# Patient Record
Sex: Female | Born: 1935 | Race: Black or African American | Hispanic: No | Smoking: Never smoker
Health system: Southern US, Community
[De-identification: ages and names within clinical notes are randomized; demographics above are authoritative.]

## PROBLEM LIST (undated history)

## (undated) DIAGNOSIS — I10 Essential (primary) hypertension: Secondary | ICD-10-CM

## (undated) DIAGNOSIS — M1990 Unspecified osteoarthritis, unspecified site: Secondary | ICD-10-CM

## (undated) DIAGNOSIS — I509 Heart failure, unspecified: Secondary | ICD-10-CM

## (undated) HISTORY — PX: ABDOMINAL HYSTERECTOMY: SHX81

---

## 1998-09-22 ENCOUNTER — Other Ambulatory Visit: Admission: RE | Admit: 1998-09-22 | Discharge: 1998-09-22 | Payer: Self-pay | Admitting: Family Medicine

## 2011-07-20 ENCOUNTER — Ambulatory Visit
Admission: RE | Admit: 2011-07-20 | Discharge: 2011-07-20 | Disposition: A | Discharge status: Home / Self Care | Payer: Medicare Other | Source: Ambulatory Visit | Attending: Family Medicine | Admitting: Family Medicine

## 2011-07-20 ENCOUNTER — Other Ambulatory Visit: Payer: Self-pay | Admitting: Family Medicine

## 2011-07-20 DIAGNOSIS — M1250 Traumatic arthropathy, unspecified site: Secondary | ICD-10-CM

## 2014-04-02 DIAGNOSIS — E0800 Diabetes mellitus due to underlying condition with hyperosmolarity without nonketotic hyperglycemic-hyperosmolar coma (NKHHC): Secondary | ICD-10-CM | POA: Diagnosis not present

## 2014-04-02 DIAGNOSIS — M1991 Primary osteoarthritis, unspecified site: Secondary | ICD-10-CM | POA: Diagnosis not present

## 2014-04-02 DIAGNOSIS — I10 Essential (primary) hypertension: Secondary | ICD-10-CM | POA: Diagnosis not present

## 2014-04-02 DIAGNOSIS — E782 Mixed hyperlipidemia: Secondary | ICD-10-CM | POA: Diagnosis not present

## 2014-04-20 DIAGNOSIS — I10 Essential (primary) hypertension: Secondary | ICD-10-CM | POA: Diagnosis not present

## 2014-04-21 DIAGNOSIS — I10 Essential (primary) hypertension: Secondary | ICD-10-CM | POA: Diagnosis not present

## 2014-04-22 DIAGNOSIS — I10 Essential (primary) hypertension: Secondary | ICD-10-CM | POA: Diagnosis not present

## 2014-04-23 DIAGNOSIS — I10 Essential (primary) hypertension: Secondary | ICD-10-CM | POA: Diagnosis not present

## 2014-04-24 DIAGNOSIS — I10 Essential (primary) hypertension: Secondary | ICD-10-CM | POA: Diagnosis not present

## 2014-04-27 DIAGNOSIS — I10 Essential (primary) hypertension: Secondary | ICD-10-CM | POA: Diagnosis not present

## 2014-04-28 DIAGNOSIS — I10 Essential (primary) hypertension: Secondary | ICD-10-CM | POA: Diagnosis not present

## 2014-04-29 DIAGNOSIS — I10 Essential (primary) hypertension: Secondary | ICD-10-CM | POA: Diagnosis not present

## 2014-04-30 DIAGNOSIS — I10 Essential (primary) hypertension: Secondary | ICD-10-CM | POA: Diagnosis not present

## 2014-05-01 DIAGNOSIS — I10 Essential (primary) hypertension: Secondary | ICD-10-CM | POA: Diagnosis not present

## 2014-05-04 DIAGNOSIS — M170 Bilateral primary osteoarthritis of knee: Secondary | ICD-10-CM | POA: Diagnosis not present

## 2014-05-04 DIAGNOSIS — I10 Essential (primary) hypertension: Secondary | ICD-10-CM | POA: Diagnosis not present

## 2014-05-05 DIAGNOSIS — I10 Essential (primary) hypertension: Secondary | ICD-10-CM | POA: Diagnosis not present

## 2014-05-06 DIAGNOSIS — I10 Essential (primary) hypertension: Secondary | ICD-10-CM | POA: Diagnosis not present

## 2014-05-07 DIAGNOSIS — I10 Essential (primary) hypertension: Secondary | ICD-10-CM | POA: Diagnosis not present

## 2014-05-08 DIAGNOSIS — E782 Mixed hyperlipidemia: Secondary | ICD-10-CM | POA: Diagnosis not present

## 2014-05-08 DIAGNOSIS — I10 Essential (primary) hypertension: Secondary | ICD-10-CM | POA: Diagnosis not present

## 2014-05-11 DIAGNOSIS — I10 Essential (primary) hypertension: Secondary | ICD-10-CM | POA: Diagnosis not present

## 2014-05-12 DIAGNOSIS — I10 Essential (primary) hypertension: Secondary | ICD-10-CM | POA: Diagnosis not present

## 2014-05-13 DIAGNOSIS — I10 Essential (primary) hypertension: Secondary | ICD-10-CM | POA: Diagnosis not present

## 2014-05-14 DIAGNOSIS — I10 Essential (primary) hypertension: Secondary | ICD-10-CM | POA: Diagnosis not present

## 2014-05-15 DIAGNOSIS — I10 Essential (primary) hypertension: Secondary | ICD-10-CM | POA: Diagnosis not present

## 2014-05-18 DIAGNOSIS — I10 Essential (primary) hypertension: Secondary | ICD-10-CM | POA: Diagnosis not present

## 2014-05-19 DIAGNOSIS — I10 Essential (primary) hypertension: Secondary | ICD-10-CM | POA: Diagnosis not present

## 2014-05-20 DIAGNOSIS — I10 Essential (primary) hypertension: Secondary | ICD-10-CM | POA: Diagnosis not present

## 2014-05-21 DIAGNOSIS — I10 Essential (primary) hypertension: Secondary | ICD-10-CM | POA: Diagnosis not present

## 2014-05-22 DIAGNOSIS — I10 Essential (primary) hypertension: Secondary | ICD-10-CM | POA: Diagnosis not present

## 2014-05-25 DIAGNOSIS — I10 Essential (primary) hypertension: Secondary | ICD-10-CM | POA: Diagnosis not present

## 2014-05-26 DIAGNOSIS — I10 Essential (primary) hypertension: Secondary | ICD-10-CM | POA: Diagnosis not present

## 2014-05-27 DIAGNOSIS — I10 Essential (primary) hypertension: Secondary | ICD-10-CM | POA: Diagnosis not present

## 2014-05-28 DIAGNOSIS — I10 Essential (primary) hypertension: Secondary | ICD-10-CM | POA: Diagnosis not present

## 2014-05-29 DIAGNOSIS — I10 Essential (primary) hypertension: Secondary | ICD-10-CM | POA: Diagnosis not present

## 2014-06-01 DIAGNOSIS — I10 Essential (primary) hypertension: Secondary | ICD-10-CM | POA: Diagnosis not present

## 2014-06-02 DIAGNOSIS — I10 Essential (primary) hypertension: Secondary | ICD-10-CM | POA: Diagnosis not present

## 2014-06-03 DIAGNOSIS — I10 Essential (primary) hypertension: Secondary | ICD-10-CM | POA: Diagnosis not present

## 2014-06-04 DIAGNOSIS — I10 Essential (primary) hypertension: Secondary | ICD-10-CM | POA: Diagnosis not present

## 2014-06-05 DIAGNOSIS — I10 Essential (primary) hypertension: Secondary | ICD-10-CM | POA: Diagnosis not present

## 2014-06-08 DIAGNOSIS — I10 Essential (primary) hypertension: Secondary | ICD-10-CM | POA: Diagnosis not present

## 2014-06-09 DIAGNOSIS — I10 Essential (primary) hypertension: Secondary | ICD-10-CM | POA: Diagnosis not present

## 2014-06-10 DIAGNOSIS — I10 Essential (primary) hypertension: Secondary | ICD-10-CM | POA: Diagnosis not present

## 2014-06-11 DIAGNOSIS — I10 Essential (primary) hypertension: Secondary | ICD-10-CM | POA: Diagnosis not present

## 2014-06-12 DIAGNOSIS — I10 Essential (primary) hypertension: Secondary | ICD-10-CM | POA: Diagnosis not present

## 2014-06-15 DIAGNOSIS — I10 Essential (primary) hypertension: Secondary | ICD-10-CM | POA: Diagnosis not present

## 2014-06-16 DIAGNOSIS — I10 Essential (primary) hypertension: Secondary | ICD-10-CM | POA: Diagnosis not present

## 2014-06-17 DIAGNOSIS — I10 Essential (primary) hypertension: Secondary | ICD-10-CM | POA: Diagnosis not present

## 2014-06-18 DIAGNOSIS — I10 Essential (primary) hypertension: Secondary | ICD-10-CM | POA: Diagnosis not present

## 2014-06-19 DIAGNOSIS — I10 Essential (primary) hypertension: Secondary | ICD-10-CM | POA: Diagnosis not present

## 2014-06-22 DIAGNOSIS — I10 Essential (primary) hypertension: Secondary | ICD-10-CM | POA: Diagnosis not present

## 2014-06-23 DIAGNOSIS — I10 Essential (primary) hypertension: Secondary | ICD-10-CM | POA: Diagnosis not present

## 2014-06-24 DIAGNOSIS — I10 Essential (primary) hypertension: Secondary | ICD-10-CM | POA: Diagnosis not present

## 2014-06-25 DIAGNOSIS — I10 Essential (primary) hypertension: Secondary | ICD-10-CM | POA: Diagnosis not present

## 2014-06-26 DIAGNOSIS — I10 Essential (primary) hypertension: Secondary | ICD-10-CM | POA: Diagnosis not present

## 2014-06-29 DIAGNOSIS — I10 Essential (primary) hypertension: Secondary | ICD-10-CM | POA: Diagnosis not present

## 2014-06-30 DIAGNOSIS — I10 Essential (primary) hypertension: Secondary | ICD-10-CM | POA: Diagnosis not present

## 2014-07-01 DIAGNOSIS — I10 Essential (primary) hypertension: Secondary | ICD-10-CM | POA: Diagnosis not present

## 2014-07-02 DIAGNOSIS — I10 Essential (primary) hypertension: Secondary | ICD-10-CM | POA: Diagnosis not present

## 2014-07-03 DIAGNOSIS — I10 Essential (primary) hypertension: Secondary | ICD-10-CM | POA: Diagnosis not present

## 2014-07-06 DIAGNOSIS — I10 Essential (primary) hypertension: Secondary | ICD-10-CM | POA: Diagnosis not present

## 2014-07-07 DIAGNOSIS — I10 Essential (primary) hypertension: Secondary | ICD-10-CM | POA: Diagnosis not present

## 2014-07-08 DIAGNOSIS — I10 Essential (primary) hypertension: Secondary | ICD-10-CM | POA: Diagnosis not present

## 2014-07-09 DIAGNOSIS — I10 Essential (primary) hypertension: Secondary | ICD-10-CM | POA: Diagnosis not present

## 2014-07-10 DIAGNOSIS — I10 Essential (primary) hypertension: Secondary | ICD-10-CM | POA: Diagnosis not present

## 2014-07-13 DIAGNOSIS — I10 Essential (primary) hypertension: Secondary | ICD-10-CM | POA: Diagnosis not present

## 2014-07-14 DIAGNOSIS — I10 Essential (primary) hypertension: Secondary | ICD-10-CM | POA: Diagnosis not present

## 2014-07-15 DIAGNOSIS — I10 Essential (primary) hypertension: Secondary | ICD-10-CM | POA: Diagnosis not present

## 2014-07-16 DIAGNOSIS — I10 Essential (primary) hypertension: Secondary | ICD-10-CM | POA: Diagnosis not present

## 2014-07-17 DIAGNOSIS — I10 Essential (primary) hypertension: Secondary | ICD-10-CM | POA: Diagnosis not present

## 2014-07-20 DIAGNOSIS — I10 Essential (primary) hypertension: Secondary | ICD-10-CM | POA: Diagnosis not present

## 2014-07-21 DIAGNOSIS — I10 Essential (primary) hypertension: Secondary | ICD-10-CM | POA: Diagnosis not present

## 2014-07-22 DIAGNOSIS — I10 Essential (primary) hypertension: Secondary | ICD-10-CM | POA: Diagnosis not present

## 2014-07-23 DIAGNOSIS — I10 Essential (primary) hypertension: Secondary | ICD-10-CM | POA: Diagnosis not present

## 2014-07-24 DIAGNOSIS — I10 Essential (primary) hypertension: Secondary | ICD-10-CM | POA: Diagnosis not present

## 2014-07-27 DIAGNOSIS — I10 Essential (primary) hypertension: Secondary | ICD-10-CM | POA: Diagnosis not present

## 2014-07-28 DIAGNOSIS — I10 Essential (primary) hypertension: Secondary | ICD-10-CM | POA: Diagnosis not present

## 2014-07-29 DIAGNOSIS — I10 Essential (primary) hypertension: Secondary | ICD-10-CM | POA: Diagnosis not present

## 2014-07-30 DIAGNOSIS — I10 Essential (primary) hypertension: Secondary | ICD-10-CM | POA: Diagnosis not present

## 2014-07-31 DIAGNOSIS — I10 Essential (primary) hypertension: Secondary | ICD-10-CM | POA: Diagnosis not present

## 2014-08-03 DIAGNOSIS — M1991 Primary osteoarthritis, unspecified site: Secondary | ICD-10-CM | POA: Diagnosis not present

## 2014-08-03 DIAGNOSIS — I10 Essential (primary) hypertension: Secondary | ICD-10-CM | POA: Diagnosis not present

## 2014-08-03 DIAGNOSIS — R0602 Shortness of breath: Secondary | ICD-10-CM | POA: Diagnosis not present

## 2014-08-03 DIAGNOSIS — E0800 Diabetes mellitus due to underlying condition with hyperosmolarity without nonketotic hyperglycemic-hyperosmolar coma (NKHHC): Secondary | ICD-10-CM | POA: Diagnosis not present

## 2014-08-04 DIAGNOSIS — M1991 Primary osteoarthritis, unspecified site: Secondary | ICD-10-CM | POA: Diagnosis not present

## 2014-08-04 DIAGNOSIS — I10 Essential (primary) hypertension: Secondary | ICD-10-CM | POA: Diagnosis not present

## 2014-08-04 DIAGNOSIS — E119 Type 2 diabetes mellitus without complications: Secondary | ICD-10-CM | POA: Diagnosis not present

## 2014-08-05 DIAGNOSIS — I10 Essential (primary) hypertension: Secondary | ICD-10-CM | POA: Diagnosis not present

## 2014-08-06 DIAGNOSIS — E119 Type 2 diabetes mellitus without complications: Secondary | ICD-10-CM | POA: Diagnosis not present

## 2014-08-06 DIAGNOSIS — I10 Essential (primary) hypertension: Secondary | ICD-10-CM | POA: Diagnosis not present

## 2014-08-06 DIAGNOSIS — M1991 Primary osteoarthritis, unspecified site: Secondary | ICD-10-CM | POA: Diagnosis not present

## 2014-08-07 DIAGNOSIS — I10 Essential (primary) hypertension: Secondary | ICD-10-CM | POA: Diagnosis not present

## 2014-08-10 DIAGNOSIS — E119 Type 2 diabetes mellitus without complications: Secondary | ICD-10-CM | POA: Diagnosis not present

## 2014-08-10 DIAGNOSIS — I10 Essential (primary) hypertension: Secondary | ICD-10-CM | POA: Diagnosis not present

## 2014-08-10 DIAGNOSIS — M1991 Primary osteoarthritis, unspecified site: Secondary | ICD-10-CM | POA: Diagnosis not present

## 2014-08-11 DIAGNOSIS — I10 Essential (primary) hypertension: Secondary | ICD-10-CM | POA: Diagnosis not present

## 2014-08-12 DIAGNOSIS — I10 Essential (primary) hypertension: Secondary | ICD-10-CM | POA: Diagnosis not present

## 2014-08-13 DIAGNOSIS — E119 Type 2 diabetes mellitus without complications: Secondary | ICD-10-CM | POA: Diagnosis not present

## 2014-08-13 DIAGNOSIS — I10 Essential (primary) hypertension: Secondary | ICD-10-CM | POA: Diagnosis not present

## 2014-08-13 DIAGNOSIS — M1991 Primary osteoarthritis, unspecified site: Secondary | ICD-10-CM | POA: Diagnosis not present

## 2014-08-14 DIAGNOSIS — I10 Essential (primary) hypertension: Secondary | ICD-10-CM | POA: Diagnosis not present

## 2014-08-19 DIAGNOSIS — I10 Essential (primary) hypertension: Secondary | ICD-10-CM | POA: Diagnosis not present

## 2014-08-19 DIAGNOSIS — M1991 Primary osteoarthritis, unspecified site: Secondary | ICD-10-CM | POA: Diagnosis not present

## 2014-08-19 DIAGNOSIS — E119 Type 2 diabetes mellitus without complications: Secondary | ICD-10-CM | POA: Diagnosis not present

## 2014-10-26 DIAGNOSIS — H11159 Pinguecula, unspecified eye: Secondary | ICD-10-CM | POA: Diagnosis not present

## 2014-10-26 DIAGNOSIS — H40013 Open angle with borderline findings, low risk, bilateral: Secondary | ICD-10-CM | POA: Diagnosis not present

## 2014-11-11 DIAGNOSIS — I10 Essential (primary) hypertension: Secondary | ICD-10-CM | POA: Diagnosis not present

## 2014-11-11 DIAGNOSIS — R7309 Other abnormal glucose: Secondary | ICD-10-CM | POA: Diagnosis not present

## 2014-11-11 DIAGNOSIS — M1991 Primary osteoarthritis, unspecified site: Secondary | ICD-10-CM | POA: Diagnosis not present

## 2014-11-11 DIAGNOSIS — I110 Hypertensive heart disease with heart failure: Secondary | ICD-10-CM | POA: Diagnosis not present

## 2014-11-11 DIAGNOSIS — E782 Mixed hyperlipidemia: Secondary | ICD-10-CM | POA: Diagnosis not present

## 2014-11-18 DIAGNOSIS — I110 Hypertensive heart disease with heart failure: Secondary | ICD-10-CM | POA: Diagnosis not present

## 2014-11-18 DIAGNOSIS — R0602 Shortness of breath: Secondary | ICD-10-CM | POA: Diagnosis not present

## 2014-12-09 DIAGNOSIS — I10 Essential (primary) hypertension: Secondary | ICD-10-CM | POA: Diagnosis not present

## 2014-12-09 DIAGNOSIS — M1991 Primary osteoarthritis, unspecified site: Secondary | ICD-10-CM | POA: Diagnosis not present

## 2014-12-09 DIAGNOSIS — E118 Type 2 diabetes mellitus with unspecified complications: Secondary | ICD-10-CM | POA: Diagnosis not present

## 2014-12-09 DIAGNOSIS — E785 Hyperlipidemia, unspecified: Secondary | ICD-10-CM | POA: Diagnosis not present

## 2014-12-11 DIAGNOSIS — M790 Rheumatism, unspecified: Secondary | ICD-10-CM | POA: Diagnosis not present

## 2014-12-11 DIAGNOSIS — M1991 Primary osteoarthritis, unspecified site: Secondary | ICD-10-CM | POA: Diagnosis not present

## 2014-12-11 DIAGNOSIS — E119 Type 2 diabetes mellitus without complications: Secondary | ICD-10-CM | POA: Diagnosis not present

## 2014-12-11 DIAGNOSIS — I509 Heart failure, unspecified: Secondary | ICD-10-CM | POA: Diagnosis not present

## 2014-12-11 DIAGNOSIS — Z7952 Long term (current) use of systemic steroids: Secondary | ICD-10-CM | POA: Diagnosis not present

## 2014-12-11 DIAGNOSIS — I110 Hypertensive heart disease with heart failure: Secondary | ICD-10-CM | POA: Diagnosis not present

## 2014-12-11 DIAGNOSIS — R634 Abnormal weight loss: Secondary | ICD-10-CM | POA: Diagnosis not present

## 2014-12-11 DIAGNOSIS — Z87891 Personal history of nicotine dependence: Secondary | ICD-10-CM | POA: Diagnosis not present

## 2014-12-15 DIAGNOSIS — E119 Type 2 diabetes mellitus without complications: Secondary | ICD-10-CM | POA: Diagnosis not present

## 2014-12-15 DIAGNOSIS — M790 Rheumatism, unspecified: Secondary | ICD-10-CM | POA: Diagnosis not present

## 2014-12-15 DIAGNOSIS — I110 Hypertensive heart disease with heart failure: Secondary | ICD-10-CM | POA: Diagnosis not present

## 2014-12-15 DIAGNOSIS — I509 Heart failure, unspecified: Secondary | ICD-10-CM | POA: Diagnosis not present

## 2014-12-15 DIAGNOSIS — M1991 Primary osteoarthritis, unspecified site: Secondary | ICD-10-CM | POA: Diagnosis not present

## 2014-12-15 DIAGNOSIS — R634 Abnormal weight loss: Secondary | ICD-10-CM | POA: Diagnosis not present

## 2014-12-15 DIAGNOSIS — Z87891 Personal history of nicotine dependence: Secondary | ICD-10-CM | POA: Diagnosis not present

## 2014-12-15 DIAGNOSIS — Z7952 Long term (current) use of systemic steroids: Secondary | ICD-10-CM | POA: Diagnosis not present

## 2014-12-16 DIAGNOSIS — Z87891 Personal history of nicotine dependence: Secondary | ICD-10-CM | POA: Diagnosis not present

## 2014-12-16 DIAGNOSIS — R634 Abnormal weight loss: Secondary | ICD-10-CM | POA: Diagnosis not present

## 2014-12-16 DIAGNOSIS — E119 Type 2 diabetes mellitus without complications: Secondary | ICD-10-CM | POA: Diagnosis not present

## 2014-12-16 DIAGNOSIS — Z7952 Long term (current) use of systemic steroids: Secondary | ICD-10-CM | POA: Diagnosis not present

## 2014-12-16 DIAGNOSIS — M1991 Primary osteoarthritis, unspecified site: Secondary | ICD-10-CM | POA: Diagnosis not present

## 2014-12-16 DIAGNOSIS — I110 Hypertensive heart disease with heart failure: Secondary | ICD-10-CM | POA: Diagnosis not present

## 2014-12-16 DIAGNOSIS — M790 Rheumatism, unspecified: Secondary | ICD-10-CM | POA: Diagnosis not present

## 2014-12-16 DIAGNOSIS — I509 Heart failure, unspecified: Secondary | ICD-10-CM | POA: Diagnosis not present

## 2014-12-17 DIAGNOSIS — M1991 Primary osteoarthritis, unspecified site: Secondary | ICD-10-CM | POA: Diagnosis not present

## 2014-12-17 DIAGNOSIS — M790 Rheumatism, unspecified: Secondary | ICD-10-CM | POA: Diagnosis not present

## 2014-12-17 DIAGNOSIS — E119 Type 2 diabetes mellitus without complications: Secondary | ICD-10-CM | POA: Diagnosis not present

## 2014-12-17 DIAGNOSIS — Z87891 Personal history of nicotine dependence: Secondary | ICD-10-CM | POA: Diagnosis not present

## 2014-12-17 DIAGNOSIS — Z7952 Long term (current) use of systemic steroids: Secondary | ICD-10-CM | POA: Diagnosis not present

## 2014-12-17 DIAGNOSIS — R634 Abnormal weight loss: Secondary | ICD-10-CM | POA: Diagnosis not present

## 2014-12-17 DIAGNOSIS — I509 Heart failure, unspecified: Secondary | ICD-10-CM | POA: Diagnosis not present

## 2014-12-17 DIAGNOSIS — I110 Hypertensive heart disease with heart failure: Secondary | ICD-10-CM | POA: Diagnosis not present

## 2014-12-21 DIAGNOSIS — R634 Abnormal weight loss: Secondary | ICD-10-CM | POA: Diagnosis not present

## 2014-12-21 DIAGNOSIS — E119 Type 2 diabetes mellitus without complications: Secondary | ICD-10-CM | POA: Diagnosis not present

## 2014-12-21 DIAGNOSIS — I110 Hypertensive heart disease with heart failure: Secondary | ICD-10-CM | POA: Diagnosis not present

## 2014-12-21 DIAGNOSIS — M790 Rheumatism, unspecified: Secondary | ICD-10-CM | POA: Diagnosis not present

## 2014-12-21 DIAGNOSIS — I509 Heart failure, unspecified: Secondary | ICD-10-CM | POA: Diagnosis not present

## 2014-12-21 DIAGNOSIS — Z7952 Long term (current) use of systemic steroids: Secondary | ICD-10-CM | POA: Diagnosis not present

## 2014-12-21 DIAGNOSIS — Z87891 Personal history of nicotine dependence: Secondary | ICD-10-CM | POA: Diagnosis not present

## 2014-12-21 DIAGNOSIS — M1991 Primary osteoarthritis, unspecified site: Secondary | ICD-10-CM | POA: Diagnosis not present

## 2014-12-23 DIAGNOSIS — M790 Rheumatism, unspecified: Secondary | ICD-10-CM | POA: Diagnosis not present

## 2014-12-23 DIAGNOSIS — R634 Abnormal weight loss: Secondary | ICD-10-CM | POA: Diagnosis not present

## 2014-12-23 DIAGNOSIS — I110 Hypertensive heart disease with heart failure: Secondary | ICD-10-CM | POA: Diagnosis not present

## 2014-12-23 DIAGNOSIS — I509 Heart failure, unspecified: Secondary | ICD-10-CM | POA: Diagnosis not present

## 2014-12-23 DIAGNOSIS — E119 Type 2 diabetes mellitus without complications: Secondary | ICD-10-CM | POA: Diagnosis not present

## 2014-12-23 DIAGNOSIS — Z7952 Long term (current) use of systemic steroids: Secondary | ICD-10-CM | POA: Diagnosis not present

## 2014-12-23 DIAGNOSIS — Z87891 Personal history of nicotine dependence: Secondary | ICD-10-CM | POA: Diagnosis not present

## 2014-12-23 DIAGNOSIS — M1991 Primary osteoarthritis, unspecified site: Secondary | ICD-10-CM | POA: Diagnosis not present

## 2014-12-25 DIAGNOSIS — Z7952 Long term (current) use of systemic steroids: Secondary | ICD-10-CM | POA: Diagnosis not present

## 2014-12-25 DIAGNOSIS — M1991 Primary osteoarthritis, unspecified site: Secondary | ICD-10-CM | POA: Diagnosis not present

## 2014-12-25 DIAGNOSIS — I509 Heart failure, unspecified: Secondary | ICD-10-CM | POA: Diagnosis not present

## 2014-12-25 DIAGNOSIS — I110 Hypertensive heart disease with heart failure: Secondary | ICD-10-CM | POA: Diagnosis not present

## 2014-12-25 DIAGNOSIS — E119 Type 2 diabetes mellitus without complications: Secondary | ICD-10-CM | POA: Diagnosis not present

## 2014-12-25 DIAGNOSIS — R634 Abnormal weight loss: Secondary | ICD-10-CM | POA: Diagnosis not present

## 2014-12-25 DIAGNOSIS — Z87891 Personal history of nicotine dependence: Secondary | ICD-10-CM | POA: Diagnosis not present

## 2014-12-25 DIAGNOSIS — M790 Rheumatism, unspecified: Secondary | ICD-10-CM | POA: Diagnosis not present

## 2014-12-28 DIAGNOSIS — I110 Hypertensive heart disease with heart failure: Secondary | ICD-10-CM | POA: Diagnosis not present

## 2014-12-28 DIAGNOSIS — Z7952 Long term (current) use of systemic steroids: Secondary | ICD-10-CM | POA: Diagnosis not present

## 2014-12-28 DIAGNOSIS — M1991 Primary osteoarthritis, unspecified site: Secondary | ICD-10-CM | POA: Diagnosis not present

## 2014-12-28 DIAGNOSIS — M790 Rheumatism, unspecified: Secondary | ICD-10-CM | POA: Diagnosis not present

## 2014-12-28 DIAGNOSIS — E119 Type 2 diabetes mellitus without complications: Secondary | ICD-10-CM | POA: Diagnosis not present

## 2014-12-28 DIAGNOSIS — Z87891 Personal history of nicotine dependence: Secondary | ICD-10-CM | POA: Diagnosis not present

## 2014-12-28 DIAGNOSIS — I509 Heart failure, unspecified: Secondary | ICD-10-CM | POA: Diagnosis not present

## 2014-12-28 DIAGNOSIS — R634 Abnormal weight loss: Secondary | ICD-10-CM | POA: Diagnosis not present

## 2014-12-29 DIAGNOSIS — E119 Type 2 diabetes mellitus without complications: Secondary | ICD-10-CM | POA: Diagnosis not present

## 2014-12-29 DIAGNOSIS — Z87891 Personal history of nicotine dependence: Secondary | ICD-10-CM | POA: Diagnosis not present

## 2014-12-29 DIAGNOSIS — M790 Rheumatism, unspecified: Secondary | ICD-10-CM | POA: Diagnosis not present

## 2014-12-29 DIAGNOSIS — M1991 Primary osteoarthritis, unspecified site: Secondary | ICD-10-CM | POA: Diagnosis not present

## 2014-12-29 DIAGNOSIS — I509 Heart failure, unspecified: Secondary | ICD-10-CM | POA: Diagnosis not present

## 2014-12-29 DIAGNOSIS — I110 Hypertensive heart disease with heart failure: Secondary | ICD-10-CM | POA: Diagnosis not present

## 2014-12-29 DIAGNOSIS — R634 Abnormal weight loss: Secondary | ICD-10-CM | POA: Diagnosis not present

## 2014-12-29 DIAGNOSIS — Z7952 Long term (current) use of systemic steroids: Secondary | ICD-10-CM | POA: Diagnosis not present

## 2014-12-30 DIAGNOSIS — R634 Abnormal weight loss: Secondary | ICD-10-CM | POA: Diagnosis not present

## 2014-12-30 DIAGNOSIS — I509 Heart failure, unspecified: Secondary | ICD-10-CM | POA: Diagnosis not present

## 2014-12-30 DIAGNOSIS — E119 Type 2 diabetes mellitus without complications: Secondary | ICD-10-CM | POA: Diagnosis not present

## 2014-12-30 DIAGNOSIS — Z87891 Personal history of nicotine dependence: Secondary | ICD-10-CM | POA: Diagnosis not present

## 2014-12-30 DIAGNOSIS — Z7952 Long term (current) use of systemic steroids: Secondary | ICD-10-CM | POA: Diagnosis not present

## 2014-12-30 DIAGNOSIS — M790 Rheumatism, unspecified: Secondary | ICD-10-CM | POA: Diagnosis not present

## 2014-12-30 DIAGNOSIS — I110 Hypertensive heart disease with heart failure: Secondary | ICD-10-CM | POA: Diagnosis not present

## 2014-12-30 DIAGNOSIS — M1991 Primary osteoarthritis, unspecified site: Secondary | ICD-10-CM | POA: Diagnosis not present

## 2014-12-31 DIAGNOSIS — M790 Rheumatism, unspecified: Secondary | ICD-10-CM | POA: Diagnosis not present

## 2014-12-31 DIAGNOSIS — Z87891 Personal history of nicotine dependence: Secondary | ICD-10-CM | POA: Diagnosis not present

## 2014-12-31 DIAGNOSIS — Z7952 Long term (current) use of systemic steroids: Secondary | ICD-10-CM | POA: Diagnosis not present

## 2014-12-31 DIAGNOSIS — E119 Type 2 diabetes mellitus without complications: Secondary | ICD-10-CM | POA: Diagnosis not present

## 2014-12-31 DIAGNOSIS — I509 Heart failure, unspecified: Secondary | ICD-10-CM | POA: Diagnosis not present

## 2014-12-31 DIAGNOSIS — R634 Abnormal weight loss: Secondary | ICD-10-CM | POA: Diagnosis not present

## 2014-12-31 DIAGNOSIS — I110 Hypertensive heart disease with heart failure: Secondary | ICD-10-CM | POA: Diagnosis not present

## 2014-12-31 DIAGNOSIS — M1991 Primary osteoarthritis, unspecified site: Secondary | ICD-10-CM | POA: Diagnosis not present

## 2015-01-04 DIAGNOSIS — E119 Type 2 diabetes mellitus without complications: Secondary | ICD-10-CM | POA: Diagnosis not present

## 2015-01-04 DIAGNOSIS — Z87891 Personal history of nicotine dependence: Secondary | ICD-10-CM | POA: Diagnosis not present

## 2015-01-04 DIAGNOSIS — I509 Heart failure, unspecified: Secondary | ICD-10-CM | POA: Diagnosis not present

## 2015-01-04 DIAGNOSIS — R634 Abnormal weight loss: Secondary | ICD-10-CM | POA: Diagnosis not present

## 2015-01-04 DIAGNOSIS — M790 Rheumatism, unspecified: Secondary | ICD-10-CM | POA: Diagnosis not present

## 2015-01-04 DIAGNOSIS — Z7952 Long term (current) use of systemic steroids: Secondary | ICD-10-CM | POA: Diagnosis not present

## 2015-01-04 DIAGNOSIS — M1991 Primary osteoarthritis, unspecified site: Secondary | ICD-10-CM | POA: Diagnosis not present

## 2015-01-04 DIAGNOSIS — I110 Hypertensive heart disease with heart failure: Secondary | ICD-10-CM | POA: Diagnosis not present

## 2015-01-05 DIAGNOSIS — M1991 Primary osteoarthritis, unspecified site: Secondary | ICD-10-CM | POA: Diagnosis not present

## 2015-01-05 DIAGNOSIS — R634 Abnormal weight loss: Secondary | ICD-10-CM | POA: Diagnosis not present

## 2015-01-05 DIAGNOSIS — E119 Type 2 diabetes mellitus without complications: Secondary | ICD-10-CM | POA: Diagnosis not present

## 2015-01-05 DIAGNOSIS — Z87891 Personal history of nicotine dependence: Secondary | ICD-10-CM | POA: Diagnosis not present

## 2015-01-05 DIAGNOSIS — M790 Rheumatism, unspecified: Secondary | ICD-10-CM | POA: Diagnosis not present

## 2015-01-05 DIAGNOSIS — Z7952 Long term (current) use of systemic steroids: Secondary | ICD-10-CM | POA: Diagnosis not present

## 2015-01-05 DIAGNOSIS — I509 Heart failure, unspecified: Secondary | ICD-10-CM | POA: Diagnosis not present

## 2015-01-05 DIAGNOSIS — I110 Hypertensive heart disease with heart failure: Secondary | ICD-10-CM | POA: Diagnosis not present

## 2015-01-06 DIAGNOSIS — E119 Type 2 diabetes mellitus without complications: Secondary | ICD-10-CM | POA: Diagnosis not present

## 2015-01-06 DIAGNOSIS — I509 Heart failure, unspecified: Secondary | ICD-10-CM | POA: Diagnosis not present

## 2015-01-06 DIAGNOSIS — M1991 Primary osteoarthritis, unspecified site: Secondary | ICD-10-CM | POA: Diagnosis not present

## 2015-01-06 DIAGNOSIS — Z7952 Long term (current) use of systemic steroids: Secondary | ICD-10-CM | POA: Diagnosis not present

## 2015-01-06 DIAGNOSIS — R634 Abnormal weight loss: Secondary | ICD-10-CM | POA: Diagnosis not present

## 2015-01-06 DIAGNOSIS — M790 Rheumatism, unspecified: Secondary | ICD-10-CM | POA: Diagnosis not present

## 2015-01-06 DIAGNOSIS — I110 Hypertensive heart disease with heart failure: Secondary | ICD-10-CM | POA: Diagnosis not present

## 2015-01-06 DIAGNOSIS — Z87891 Personal history of nicotine dependence: Secondary | ICD-10-CM | POA: Diagnosis not present

## 2015-01-12 DIAGNOSIS — Z7952 Long term (current) use of systemic steroids: Secondary | ICD-10-CM | POA: Diagnosis not present

## 2015-01-12 DIAGNOSIS — I509 Heart failure, unspecified: Secondary | ICD-10-CM | POA: Diagnosis not present

## 2015-01-12 DIAGNOSIS — R634 Abnormal weight loss: Secondary | ICD-10-CM | POA: Diagnosis not present

## 2015-01-12 DIAGNOSIS — Z87891 Personal history of nicotine dependence: Secondary | ICD-10-CM | POA: Diagnosis not present

## 2015-01-12 DIAGNOSIS — E119 Type 2 diabetes mellitus without complications: Secondary | ICD-10-CM | POA: Diagnosis not present

## 2015-01-12 DIAGNOSIS — M1991 Primary osteoarthritis, unspecified site: Secondary | ICD-10-CM | POA: Diagnosis not present

## 2015-01-12 DIAGNOSIS — M790 Rheumatism, unspecified: Secondary | ICD-10-CM | POA: Diagnosis not present

## 2015-01-12 DIAGNOSIS — I110 Hypertensive heart disease with heart failure: Secondary | ICD-10-CM | POA: Diagnosis not present

## 2015-01-18 DIAGNOSIS — E119 Type 2 diabetes mellitus without complications: Secondary | ICD-10-CM | POA: Diagnosis not present

## 2015-01-18 DIAGNOSIS — R634 Abnormal weight loss: Secondary | ICD-10-CM | POA: Diagnosis not present

## 2015-01-18 DIAGNOSIS — Z87891 Personal history of nicotine dependence: Secondary | ICD-10-CM | POA: Diagnosis not present

## 2015-01-18 DIAGNOSIS — Z7952 Long term (current) use of systemic steroids: Secondary | ICD-10-CM | POA: Diagnosis not present

## 2015-01-18 DIAGNOSIS — M790 Rheumatism, unspecified: Secondary | ICD-10-CM | POA: Diagnosis not present

## 2015-01-18 DIAGNOSIS — M1991 Primary osteoarthritis, unspecified site: Secondary | ICD-10-CM | POA: Diagnosis not present

## 2015-01-18 DIAGNOSIS — I509 Heart failure, unspecified: Secondary | ICD-10-CM | POA: Diagnosis not present

## 2015-01-18 DIAGNOSIS — I110 Hypertensive heart disease with heart failure: Secondary | ICD-10-CM | POA: Diagnosis not present

## 2015-01-26 DIAGNOSIS — Z87891 Personal history of nicotine dependence: Secondary | ICD-10-CM | POA: Diagnosis not present

## 2015-01-26 DIAGNOSIS — E119 Type 2 diabetes mellitus without complications: Secondary | ICD-10-CM | POA: Diagnosis not present

## 2015-01-26 DIAGNOSIS — R634 Abnormal weight loss: Secondary | ICD-10-CM | POA: Diagnosis not present

## 2015-01-26 DIAGNOSIS — M790 Rheumatism, unspecified: Secondary | ICD-10-CM | POA: Diagnosis not present

## 2015-01-26 DIAGNOSIS — I110 Hypertensive heart disease with heart failure: Secondary | ICD-10-CM | POA: Diagnosis not present

## 2015-01-26 DIAGNOSIS — Z7952 Long term (current) use of systemic steroids: Secondary | ICD-10-CM | POA: Diagnosis not present

## 2015-01-26 DIAGNOSIS — M1991 Primary osteoarthritis, unspecified site: Secondary | ICD-10-CM | POA: Diagnosis not present

## 2015-01-26 DIAGNOSIS — I509 Heart failure, unspecified: Secondary | ICD-10-CM | POA: Diagnosis not present

## 2015-02-05 DIAGNOSIS — E119 Type 2 diabetes mellitus without complications: Secondary | ICD-10-CM | POA: Diagnosis not present

## 2015-02-05 DIAGNOSIS — M790 Rheumatism, unspecified: Secondary | ICD-10-CM | POA: Diagnosis not present

## 2015-02-05 DIAGNOSIS — Z87891 Personal history of nicotine dependence: Secondary | ICD-10-CM | POA: Diagnosis not present

## 2015-02-05 DIAGNOSIS — Z7952 Long term (current) use of systemic steroids: Secondary | ICD-10-CM | POA: Diagnosis not present

## 2015-02-05 DIAGNOSIS — I509 Heart failure, unspecified: Secondary | ICD-10-CM | POA: Diagnosis not present

## 2015-02-05 DIAGNOSIS — I110 Hypertensive heart disease with heart failure: Secondary | ICD-10-CM | POA: Diagnosis not present

## 2015-02-05 DIAGNOSIS — M1991 Primary osteoarthritis, unspecified site: Secondary | ICD-10-CM | POA: Diagnosis not present

## 2015-02-05 DIAGNOSIS — R634 Abnormal weight loss: Secondary | ICD-10-CM | POA: Diagnosis not present

## 2015-04-09 DIAGNOSIS — E118 Type 2 diabetes mellitus with unspecified complications: Secondary | ICD-10-CM | POA: Diagnosis not present

## 2015-04-09 DIAGNOSIS — M1991 Primary osteoarthritis, unspecified site: Secondary | ICD-10-CM | POA: Diagnosis not present

## 2015-04-09 DIAGNOSIS — Z0000 Encounter for general adult medical examination without abnormal findings: Secondary | ICD-10-CM | POA: Diagnosis not present

## 2015-04-09 DIAGNOSIS — E782 Mixed hyperlipidemia: Secondary | ICD-10-CM | POA: Diagnosis not present

## 2015-04-09 DIAGNOSIS — I10 Essential (primary) hypertension: Secondary | ICD-10-CM | POA: Diagnosis not present

## 2015-06-21 DIAGNOSIS — M1991 Primary osteoarthritis, unspecified site: Secondary | ICD-10-CM | POA: Diagnosis not present

## 2015-06-21 DIAGNOSIS — I110 Hypertensive heart disease with heart failure: Secondary | ICD-10-CM | POA: Diagnosis not present

## 2015-06-21 DIAGNOSIS — I10 Essential (primary) hypertension: Secondary | ICD-10-CM | POA: Diagnosis not present

## 2015-06-21 DIAGNOSIS — E785 Hyperlipidemia, unspecified: Secondary | ICD-10-CM | POA: Diagnosis not present

## 2015-07-16 DIAGNOSIS — R0602 Shortness of breath: Secondary | ICD-10-CM | POA: Diagnosis not present

## 2015-07-16 DIAGNOSIS — I10 Essential (primary) hypertension: Secondary | ICD-10-CM | POA: Diagnosis not present

## 2015-08-04 DIAGNOSIS — I509 Heart failure, unspecified: Secondary | ICD-10-CM | POA: Diagnosis not present

## 2015-08-04 DIAGNOSIS — I110 Hypertensive heart disease with heart failure: Secondary | ICD-10-CM | POA: Diagnosis not present

## 2015-08-04 DIAGNOSIS — I10 Essential (primary) hypertension: Secondary | ICD-10-CM | POA: Diagnosis not present

## 2015-09-03 DIAGNOSIS — E785 Hyperlipidemia, unspecified: Secondary | ICD-10-CM | POA: Diagnosis not present

## 2015-09-03 DIAGNOSIS — I10 Essential (primary) hypertension: Secondary | ICD-10-CM | POA: Diagnosis not present

## 2015-09-03 DIAGNOSIS — E118 Type 2 diabetes mellitus with unspecified complications: Secondary | ICD-10-CM | POA: Diagnosis not present

## 2015-09-08 DIAGNOSIS — I509 Heart failure, unspecified: Secondary | ICD-10-CM | POA: Diagnosis not present

## 2015-09-08 DIAGNOSIS — Z7952 Long term (current) use of systemic steroids: Secondary | ICD-10-CM | POA: Diagnosis not present

## 2015-09-08 DIAGNOSIS — E119 Type 2 diabetes mellitus without complications: Secondary | ICD-10-CM | POA: Diagnosis not present

## 2015-09-08 DIAGNOSIS — Z9181 History of falling: Secondary | ICD-10-CM | POA: Diagnosis not present

## 2015-09-08 DIAGNOSIS — I110 Hypertensive heart disease with heart failure: Secondary | ICD-10-CM | POA: Diagnosis not present

## 2015-09-08 DIAGNOSIS — Z87891 Personal history of nicotine dependence: Secondary | ICD-10-CM | POA: Diagnosis not present

## 2015-09-08 DIAGNOSIS — M790 Rheumatism, unspecified: Secondary | ICD-10-CM | POA: Diagnosis not present

## 2015-09-10 DIAGNOSIS — Z9181 History of falling: Secondary | ICD-10-CM | POA: Diagnosis not present

## 2015-09-10 DIAGNOSIS — I509 Heart failure, unspecified: Secondary | ICD-10-CM | POA: Diagnosis not present

## 2015-09-10 DIAGNOSIS — Z87891 Personal history of nicotine dependence: Secondary | ICD-10-CM | POA: Diagnosis not present

## 2015-09-10 DIAGNOSIS — I110 Hypertensive heart disease with heart failure: Secondary | ICD-10-CM | POA: Diagnosis not present

## 2015-09-10 DIAGNOSIS — E119 Type 2 diabetes mellitus without complications: Secondary | ICD-10-CM | POA: Diagnosis not present

## 2015-09-10 DIAGNOSIS — Z7952 Long term (current) use of systemic steroids: Secondary | ICD-10-CM | POA: Diagnosis not present

## 2015-09-10 DIAGNOSIS — M790 Rheumatism, unspecified: Secondary | ICD-10-CM | POA: Diagnosis not present

## 2015-09-14 DIAGNOSIS — M790 Rheumatism, unspecified: Secondary | ICD-10-CM | POA: Diagnosis not present

## 2015-09-14 DIAGNOSIS — E119 Type 2 diabetes mellitus without complications: Secondary | ICD-10-CM | POA: Diagnosis not present

## 2015-09-14 DIAGNOSIS — Z7952 Long term (current) use of systemic steroids: Secondary | ICD-10-CM | POA: Diagnosis not present

## 2015-09-14 DIAGNOSIS — Z9181 History of falling: Secondary | ICD-10-CM | POA: Diagnosis not present

## 2015-09-14 DIAGNOSIS — I509 Heart failure, unspecified: Secondary | ICD-10-CM | POA: Diagnosis not present

## 2015-09-14 DIAGNOSIS — Z87891 Personal history of nicotine dependence: Secondary | ICD-10-CM | POA: Diagnosis not present

## 2015-09-14 DIAGNOSIS — I110 Hypertensive heart disease with heart failure: Secondary | ICD-10-CM | POA: Diagnosis not present

## 2015-09-17 DIAGNOSIS — I509 Heart failure, unspecified: Secondary | ICD-10-CM | POA: Diagnosis not present

## 2015-09-17 DIAGNOSIS — I110 Hypertensive heart disease with heart failure: Secondary | ICD-10-CM | POA: Diagnosis not present

## 2015-09-17 DIAGNOSIS — E119 Type 2 diabetes mellitus without complications: Secondary | ICD-10-CM | POA: Diagnosis not present

## 2015-09-17 DIAGNOSIS — Z87891 Personal history of nicotine dependence: Secondary | ICD-10-CM | POA: Diagnosis not present

## 2015-09-17 DIAGNOSIS — Z7952 Long term (current) use of systemic steroids: Secondary | ICD-10-CM | POA: Diagnosis not present

## 2015-09-17 DIAGNOSIS — Z9181 History of falling: Secondary | ICD-10-CM | POA: Diagnosis not present

## 2015-09-17 DIAGNOSIS — M790 Rheumatism, unspecified: Secondary | ICD-10-CM | POA: Diagnosis not present

## 2015-09-21 DIAGNOSIS — Z87891 Personal history of nicotine dependence: Secondary | ICD-10-CM | POA: Diagnosis not present

## 2015-09-21 DIAGNOSIS — E119 Type 2 diabetes mellitus without complications: Secondary | ICD-10-CM | POA: Diagnosis not present

## 2015-09-21 DIAGNOSIS — M790 Rheumatism, unspecified: Secondary | ICD-10-CM | POA: Diagnosis not present

## 2015-09-21 DIAGNOSIS — I509 Heart failure, unspecified: Secondary | ICD-10-CM | POA: Diagnosis not present

## 2015-09-21 DIAGNOSIS — Z9181 History of falling: Secondary | ICD-10-CM | POA: Diagnosis not present

## 2015-09-21 DIAGNOSIS — I110 Hypertensive heart disease with heart failure: Secondary | ICD-10-CM | POA: Diagnosis not present

## 2015-09-21 DIAGNOSIS — Z7952 Long term (current) use of systemic steroids: Secondary | ICD-10-CM | POA: Diagnosis not present

## 2015-09-24 DIAGNOSIS — E119 Type 2 diabetes mellitus without complications: Secondary | ICD-10-CM | POA: Diagnosis not present

## 2015-09-24 DIAGNOSIS — I110 Hypertensive heart disease with heart failure: Secondary | ICD-10-CM | POA: Diagnosis not present

## 2015-09-24 DIAGNOSIS — Z7952 Long term (current) use of systemic steroids: Secondary | ICD-10-CM | POA: Diagnosis not present

## 2015-09-24 DIAGNOSIS — I509 Heart failure, unspecified: Secondary | ICD-10-CM | POA: Diagnosis not present

## 2015-09-24 DIAGNOSIS — Z87891 Personal history of nicotine dependence: Secondary | ICD-10-CM | POA: Diagnosis not present

## 2015-09-24 DIAGNOSIS — Z9181 History of falling: Secondary | ICD-10-CM | POA: Diagnosis not present

## 2015-09-24 DIAGNOSIS — M790 Rheumatism, unspecified: Secondary | ICD-10-CM | POA: Diagnosis not present

## 2015-09-28 DIAGNOSIS — I110 Hypertensive heart disease with heart failure: Secondary | ICD-10-CM | POA: Diagnosis not present

## 2015-09-28 DIAGNOSIS — Z7952 Long term (current) use of systemic steroids: Secondary | ICD-10-CM | POA: Diagnosis not present

## 2015-09-28 DIAGNOSIS — E119 Type 2 diabetes mellitus without complications: Secondary | ICD-10-CM | POA: Diagnosis not present

## 2015-09-28 DIAGNOSIS — Z87891 Personal history of nicotine dependence: Secondary | ICD-10-CM | POA: Diagnosis not present

## 2015-09-28 DIAGNOSIS — M790 Rheumatism, unspecified: Secondary | ICD-10-CM | POA: Diagnosis not present

## 2015-09-28 DIAGNOSIS — Z9181 History of falling: Secondary | ICD-10-CM | POA: Diagnosis not present

## 2015-09-28 DIAGNOSIS — I509 Heart failure, unspecified: Secondary | ICD-10-CM | POA: Diagnosis not present

## 2015-10-01 DIAGNOSIS — I509 Heart failure, unspecified: Secondary | ICD-10-CM | POA: Diagnosis not present

## 2015-10-01 DIAGNOSIS — Z87891 Personal history of nicotine dependence: Secondary | ICD-10-CM | POA: Diagnosis not present

## 2015-10-01 DIAGNOSIS — M790 Rheumatism, unspecified: Secondary | ICD-10-CM | POA: Diagnosis not present

## 2015-10-01 DIAGNOSIS — E119 Type 2 diabetes mellitus without complications: Secondary | ICD-10-CM | POA: Diagnosis not present

## 2015-10-01 DIAGNOSIS — Z7952 Long term (current) use of systemic steroids: Secondary | ICD-10-CM | POA: Diagnosis not present

## 2015-10-01 DIAGNOSIS — Z9181 History of falling: Secondary | ICD-10-CM | POA: Diagnosis not present

## 2015-10-01 DIAGNOSIS — I110 Hypertensive heart disease with heart failure: Secondary | ICD-10-CM | POA: Diagnosis not present

## 2015-10-05 DIAGNOSIS — E782 Mixed hyperlipidemia: Secondary | ICD-10-CM | POA: Diagnosis not present

## 2015-10-05 DIAGNOSIS — I110 Hypertensive heart disease with heart failure: Secondary | ICD-10-CM | POA: Diagnosis not present

## 2015-10-05 DIAGNOSIS — R2689 Other abnormalities of gait and mobility: Secondary | ICD-10-CM | POA: Diagnosis not present

## 2015-10-05 DIAGNOSIS — E118 Type 2 diabetes mellitus with unspecified complications: Secondary | ICD-10-CM | POA: Diagnosis not present

## 2015-10-06 DIAGNOSIS — Z7952 Long term (current) use of systemic steroids: Secondary | ICD-10-CM | POA: Diagnosis not present

## 2015-10-06 DIAGNOSIS — Z87891 Personal history of nicotine dependence: Secondary | ICD-10-CM | POA: Diagnosis not present

## 2015-10-06 DIAGNOSIS — M790 Rheumatism, unspecified: Secondary | ICD-10-CM | POA: Diagnosis not present

## 2015-10-06 DIAGNOSIS — I110 Hypertensive heart disease with heart failure: Secondary | ICD-10-CM | POA: Diagnosis not present

## 2015-10-06 DIAGNOSIS — Z9181 History of falling: Secondary | ICD-10-CM | POA: Diagnosis not present

## 2015-10-06 DIAGNOSIS — I509 Heart failure, unspecified: Secondary | ICD-10-CM | POA: Diagnosis not present

## 2015-10-06 DIAGNOSIS — E119 Type 2 diabetes mellitus without complications: Secondary | ICD-10-CM | POA: Diagnosis not present

## 2015-10-13 DIAGNOSIS — M790 Rheumatism, unspecified: Secondary | ICD-10-CM | POA: Diagnosis not present

## 2015-10-13 DIAGNOSIS — I509 Heart failure, unspecified: Secondary | ICD-10-CM | POA: Diagnosis not present

## 2015-10-13 DIAGNOSIS — Z7952 Long term (current) use of systemic steroids: Secondary | ICD-10-CM | POA: Diagnosis not present

## 2015-10-13 DIAGNOSIS — I110 Hypertensive heart disease with heart failure: Secondary | ICD-10-CM | POA: Diagnosis not present

## 2015-10-13 DIAGNOSIS — E119 Type 2 diabetes mellitus without complications: Secondary | ICD-10-CM | POA: Diagnosis not present

## 2015-10-13 DIAGNOSIS — Z87891 Personal history of nicotine dependence: Secondary | ICD-10-CM | POA: Diagnosis not present

## 2015-10-13 DIAGNOSIS — Z9181 History of falling: Secondary | ICD-10-CM | POA: Diagnosis not present

## 2015-10-23 DIAGNOSIS — Z9181 History of falling: Secondary | ICD-10-CM | POA: Diagnosis not present

## 2015-10-23 DIAGNOSIS — I509 Heart failure, unspecified: Secondary | ICD-10-CM | POA: Diagnosis not present

## 2015-10-23 DIAGNOSIS — E119 Type 2 diabetes mellitus without complications: Secondary | ICD-10-CM | POA: Diagnosis not present

## 2015-10-23 DIAGNOSIS — M790 Rheumatism, unspecified: Secondary | ICD-10-CM | POA: Diagnosis not present

## 2015-10-23 DIAGNOSIS — Z7952 Long term (current) use of systemic steroids: Secondary | ICD-10-CM | POA: Diagnosis not present

## 2015-10-23 DIAGNOSIS — Z87891 Personal history of nicotine dependence: Secondary | ICD-10-CM | POA: Diagnosis not present

## 2015-10-23 DIAGNOSIS — I110 Hypertensive heart disease with heart failure: Secondary | ICD-10-CM | POA: Diagnosis not present

## 2015-10-26 DIAGNOSIS — H353131 Nonexudative age-related macular degeneration, bilateral, early dry stage: Secondary | ICD-10-CM | POA: Diagnosis not present

## 2015-10-26 DIAGNOSIS — H40013 Open angle with borderline findings, low risk, bilateral: Secondary | ICD-10-CM | POA: Diagnosis not present

## 2015-10-26 DIAGNOSIS — H35463 Secondary vitreoretinal degeneration, bilateral: Secondary | ICD-10-CM | POA: Diagnosis not present

## 2015-10-26 DIAGNOSIS — H40003 Preglaucoma, unspecified, bilateral: Secondary | ICD-10-CM | POA: Diagnosis not present

## 2015-10-26 DIAGNOSIS — H35033 Hypertensive retinopathy, bilateral: Secondary | ICD-10-CM | POA: Diagnosis not present

## 2015-10-27 DIAGNOSIS — Z87891 Personal history of nicotine dependence: Secondary | ICD-10-CM | POA: Diagnosis not present

## 2015-10-27 DIAGNOSIS — E119 Type 2 diabetes mellitus without complications: Secondary | ICD-10-CM | POA: Diagnosis not present

## 2015-10-27 DIAGNOSIS — M790 Rheumatism, unspecified: Secondary | ICD-10-CM | POA: Diagnosis not present

## 2015-10-27 DIAGNOSIS — I110 Hypertensive heart disease with heart failure: Secondary | ICD-10-CM | POA: Diagnosis not present

## 2015-10-27 DIAGNOSIS — Z9181 History of falling: Secondary | ICD-10-CM | POA: Diagnosis not present

## 2015-10-27 DIAGNOSIS — Z7952 Long term (current) use of systemic steroids: Secondary | ICD-10-CM | POA: Diagnosis not present

## 2015-10-27 DIAGNOSIS — I509 Heart failure, unspecified: Secondary | ICD-10-CM | POA: Diagnosis not present

## 2015-11-05 DIAGNOSIS — E118 Type 2 diabetes mellitus with unspecified complications: Secondary | ICD-10-CM | POA: Diagnosis not present

## 2015-11-05 DIAGNOSIS — M1991 Primary osteoarthritis, unspecified site: Secondary | ICD-10-CM | POA: Diagnosis not present

## 2015-11-05 DIAGNOSIS — I10 Essential (primary) hypertension: Secondary | ICD-10-CM | POA: Diagnosis not present

## 2015-11-05 DIAGNOSIS — E782 Mixed hyperlipidemia: Secondary | ICD-10-CM | POA: Diagnosis not present

## 2015-12-17 DIAGNOSIS — I509 Heart failure, unspecified: Secondary | ICD-10-CM | POA: Diagnosis not present

## 2015-12-17 DIAGNOSIS — E785 Hyperlipidemia, unspecified: Secondary | ICD-10-CM | POA: Diagnosis not present

## 2015-12-17 DIAGNOSIS — M1991 Primary osteoarthritis, unspecified site: Secondary | ICD-10-CM | POA: Diagnosis not present

## 2015-12-17 DIAGNOSIS — I10 Essential (primary) hypertension: Secondary | ICD-10-CM | POA: Diagnosis not present

## 2015-12-23 ENCOUNTER — Encounter (HOSPITAL_COMMUNITY): Payer: Self-pay | Admitting: Emergency Medicine

## 2015-12-23 ENCOUNTER — Observation Stay (HOSPITAL_COMMUNITY)
Admission: EM | Admit: 2015-12-23 | Discharge: 2015-12-25 | Disposition: A | Discharge status: Home / Self Care | Payer: Medicare Other | Attending: Internal Medicine | Admitting: Internal Medicine

## 2015-12-23 ENCOUNTER — Emergency Department (HOSPITAL_COMMUNITY): Payer: Medicare Other

## 2015-12-23 DIAGNOSIS — I679 Cerebrovascular disease, unspecified: Secondary | ICD-10-CM | POA: Diagnosis not present

## 2015-12-23 DIAGNOSIS — G8194 Hemiplegia, unspecified affecting left nondominant side: Secondary | ICD-10-CM | POA: Diagnosis not present

## 2015-12-23 DIAGNOSIS — Z9842 Cataract extraction status, left eye: Secondary | ICD-10-CM | POA: Diagnosis not present

## 2015-12-23 DIAGNOSIS — I6523 Occlusion and stenosis of bilateral carotid arteries: Secondary | ICD-10-CM | POA: Diagnosis not present

## 2015-12-23 DIAGNOSIS — I509 Heart failure, unspecified: Secondary | ICD-10-CM | POA: Diagnosis not present

## 2015-12-23 DIAGNOSIS — R55 Syncope and collapse: Principal | ICD-10-CM | POA: Diagnosis present

## 2015-12-23 DIAGNOSIS — G451 Carotid artery syndrome (hemispheric): Secondary | ICD-10-CM | POA: Diagnosis not present

## 2015-12-23 DIAGNOSIS — E785 Hyperlipidemia, unspecified: Secondary | ICD-10-CM

## 2015-12-23 DIAGNOSIS — Z9841 Cataract extraction status, right eye: Secondary | ICD-10-CM | POA: Diagnosis not present

## 2015-12-23 DIAGNOSIS — R404 Transient alteration of awareness: Secondary | ICD-10-CM | POA: Diagnosis not present

## 2015-12-23 DIAGNOSIS — I110 Hypertensive heart disease with heart failure: Secondary | ICD-10-CM | POA: Diagnosis not present

## 2015-12-23 DIAGNOSIS — N179 Acute kidney failure, unspecified: Secondary | ICD-10-CM

## 2015-12-23 DIAGNOSIS — N289 Disorder of kidney and ureter, unspecified: Secondary | ICD-10-CM | POA: Insufficient documentation

## 2015-12-23 DIAGNOSIS — R29810 Facial weakness: Secondary | ICD-10-CM | POA: Diagnosis not present

## 2015-12-23 DIAGNOSIS — I7389 Other specified peripheral vascular diseases: Secondary | ICD-10-CM | POA: Diagnosis not present

## 2015-12-23 DIAGNOSIS — M1990 Unspecified osteoarthritis, unspecified site: Secondary | ICD-10-CM | POA: Insufficient documentation

## 2015-12-23 DIAGNOSIS — Z90710 Acquired absence of both cervix and uterus: Secondary | ICD-10-CM | POA: Diagnosis not present

## 2015-12-23 DIAGNOSIS — I10 Essential (primary) hypertension: Secondary | ICD-10-CM

## 2015-12-23 DIAGNOSIS — G459 Transient cerebral ischemic attack, unspecified: Secondary | ICD-10-CM | POA: Diagnosis present

## 2015-12-23 HISTORY — DX: Heart failure, unspecified: I50.9

## 2015-12-23 HISTORY — DX: Unspecified osteoarthritis, unspecified site: M19.90

## 2015-12-23 HISTORY — DX: Essential (primary) hypertension: I10

## 2015-12-23 LAB — URINALYSIS, ROUTINE W REFLEX MICROSCOPIC
Bilirubin Urine: NEGATIVE
GLUCOSE, UA: NEGATIVE mg/dL
Hgb urine dipstick: NEGATIVE
KETONES UR: NEGATIVE mg/dL
LEUKOCYTES UA: NEGATIVE
NITRITE: NEGATIVE
PH: 7.5 (ref 5.0–8.0)
Protein, ur: NEGATIVE mg/dL
SPECIFIC GRAVITY, URINE: 1.015 (ref 1.005–1.030)

## 2015-12-23 LAB — BASIC METABOLIC PANEL
ANION GAP: 11 (ref 5–15)
BUN: 23 mg/dL — AB (ref 6–20)
CHLORIDE: 106 mmol/L (ref 101–111)
CO2: 19 mmol/L — ABNORMAL LOW (ref 22–32)
Calcium: 9.1 mg/dL (ref 8.9–10.3)
Creatinine, Ser: 1.55 mg/dL — ABNORMAL HIGH (ref 0.44–1.00)
GFR calc Af Amer: 33 mL/min — ABNORMAL LOW (ref >60)
GFR, EST NON AFRICAN AMERICAN: 29 mL/min — AB (ref >60)
Glucose, Bld: 135 mg/dL — ABNORMAL HIGH (ref 65–99)
POTASSIUM: 4 mmol/L (ref 3.5–5.1)
SODIUM: 136 mmol/L (ref 135–145)

## 2015-12-23 LAB — DIFFERENTIAL
BASOS ABS: 0 10*3/uL (ref 0.0–0.1)
BASOS PCT: 0 %
Eosinophils Absolute: 0 10*3/uL (ref 0.0–0.7)
Eosinophils Relative: 0 %
LYMPHS ABS: 1.3 10*3/uL (ref 0.7–4.0)
Lymphocytes Relative: 19 %
MONOS PCT: 9 %
Monocytes Absolute: 0.6 10*3/uL (ref 0.1–1.0)
NEUTROS ABS: 5 10*3/uL (ref 1.7–7.7)
NEUTROS PCT: 72 %

## 2015-12-23 LAB — I-STAT CHEM 8, ED
BUN: 25 mg/dL — AB (ref 6–20)
CHLORIDE: 106 mmol/L (ref 101–111)
CREATININE: 1.5 mg/dL — AB (ref 0.44–1.00)
Calcium, Ion: 1.1 mmol/L — ABNORMAL LOW (ref 1.15–1.40)
Glucose, Bld: 134 mg/dL — ABNORMAL HIGH (ref 65–99)
HEMATOCRIT: 35 % — AB (ref 36.0–46.0)
Hemoglobin: 11.9 g/dL — ABNORMAL LOW (ref 12.0–15.0)
POTASSIUM: 4.2 mmol/L (ref 3.5–5.1)
SODIUM: 138 mmol/L (ref 135–145)
TCO2: 20 mmol/L (ref 0–100)

## 2015-12-23 LAB — CBC
HEMATOCRIT: 33.8 % — AB (ref 36.0–46.0)
HEMOGLOBIN: 11.3 g/dL — AB (ref 12.0–15.0)
MCH: 29.9 pg (ref 26.0–34.0)
MCHC: 33.4 g/dL (ref 30.0–36.0)
MCV: 89.4 fL (ref 78.0–100.0)
Platelets: 249 10*3/uL (ref 150–400)
RBC: 3.78 MIL/uL — ABNORMAL LOW (ref 3.87–5.11)
RDW: 14.9 % (ref 11.5–15.5)
WBC: 7.3 10*3/uL (ref 4.0–10.5)

## 2015-12-23 LAB — I-STAT TROPONIN, ED: TROPONIN I, POC: 0.01 ng/mL (ref 0.00–0.08)

## 2015-12-23 LAB — APTT: APTT: 30 s (ref 24–36)

## 2015-12-23 LAB — PROTIME-INR
INR: 1.25
Prothrombin Time: 15.8 seconds — ABNORMAL HIGH (ref 11.4–15.2)

## 2015-12-23 LAB — CBG MONITORING, ED: Glucose-Capillary: 112 mg/dL — ABNORMAL HIGH (ref 65–99)

## 2015-12-23 MED ORDER — ASPIRIN 300 MG RE SUPP
300.0000 mg | Freq: Every day | RECTAL | Status: DC
  Filled 2015-12-23: qty 1

## 2015-12-23 MED ORDER — ASPIRIN 325 MG PO TABS
325.0000 mg | ORAL_TABLET | Freq: Every day | ORAL | Status: DC
  Administered 2015-12-23 – 2015-12-24 (×2): 325 mg via ORAL
  Filled 2015-12-23 (×2): qty 1

## 2015-12-23 MED ORDER — SODIUM CHLORIDE 0.9 % IV BOLUS (SEPSIS)
1000.0000 mL | Freq: Once | INTRAVENOUS | Status: AC
  Administered 2015-12-23: 1000 mL via INTRAVENOUS

## 2015-12-23 MED ORDER — STROKE: EARLY STAGES OF RECOVERY BOOK
Freq: Once | Status: AC
  Administered 2015-12-23: 23:00:00
  Filled 2015-12-23: qty 1

## 2015-12-23 NOTE — ED Provider Notes (Signed)
[facility] DEPT Provider Note   CSN: [id number] Arrival date & time: 12/23/15  1427     History   Chief Complaint Chief Complaint  Patient presents with  . Loss of Consciousness    HPI Yvette Lozano is a 82 y.o. female.  82yo F w/ PMH including CHF, HTN, arthritis who p/w syncope. Daughter reported to EMS that the patient had a witnessed syncopal episode that lasted approximately 1-2 minutes. She was able to lower the patient from the chair to the floor without any fall or injury. Patient had one episode of vomiting. On arrival to the ER, the patient was awake and alert without any complaints. Specifically she denies any chest pain, shortness of breath, abdominal pain, or nausea. She states that she has been well recently with no fevers/chills or cough/cold symptoms. She took her normal medications this morning. She states that she has some shortness of breath with exertion, she gets "winded" going to the bathroom but this is usual for her and has not changed recently.   The history is provided by the patient and a relative.  Loss of Consciousness      Past Medical History:  Diagnosis Date  . Arthritis   . CHF (congestive heart failure) ([place])   . Hypertension     There are no active problems to display for this patient.   Past Surgical History:  Procedure Laterality Date  . ABDOMINAL HYSTERECTOMY      OB History    No data available       Home Medications    Prior to Admission medications   Not on File    Family History No family history on file.  Social History Social History  Substance Use Topics  . Smoking status: Never Smoker  . Smokeless tobacco: Never Used  . Alcohol use No     Allergies   Review of patient's allergies indicates not on file.   Review of Systems Review of Systems  Cardiovascular: Positive for syncope.   10 Systems reviewed and are negative for acute change except as noted in the HPI.   Physical Exam Updated Vital  Signs BP 121/79   Pulse 82   Temp 98.1 F (36.7 C) (Oral)   Resp 17   Ht _0  (1.6 m)   Wt 162 lb (73.5 kg)   SpO2 98%   BMI 28.70 kg/m   Physical Exam  Constitutional: She is oriented to person, place, and time. She appears well-developed and well-nourished. No distress.  Awake, alert  HENT:  Head: Normocephalic and atraumatic.  Eyes: Conjunctivae and EOM are normal. Pupils are equal, round, and reactive to light.  Neck: Neck supple.  Cardiovascular: Normal rate, regular rhythm and normal heart sounds.   No murmur heard. Pulmonary/Chest: Effort normal and breath sounds normal. No respiratory distress.  Abdominal: Soft. Bowel sounds are normal. She exhibits no distension. There is no tenderness.  Musculoskeletal: She exhibits edema (mild BLE).  Neurological: She is alert and oriented to person, place, and time. She has normal reflexes. No cranial nerve deficit. She exhibits normal muscle tone.  Fluent speech, normal finger-to-nose testing, negative pronator drift, no clonus 5/5 strength and normal sensation x all 4 extremities  Skin: Skin is warm and dry.  Psychiatric: She has a normal mood and affect. Judgment and thought content normal.  Nursing note and vitals reviewed.    ED Treatments / Results  Labs (all labs ordered are listed, but only abnormal results are displayed) Labs Reviewed  PROTIME-INR - Abnormal; Notable for the following:       Result Value   Prothrombin Time 15.8 (*)    All other components within normal limits  I-STAT CHEM 8, ED - Abnormal; Notable for the following:    BUN 25 (*)    Creatinine, Ser 1.50 (*)    Glucose, Bld 134 (*)    Calcium, Ion 1.10 (*)    Hemoglobin 11.9 (*)    HCT 35.0 (*)    All other components within normal limits  APTT  BASIC METABOLIC PANEL  CBC  URINALYSIS, ROUTINE W REFLEX MICROSCOPIC (NOT AT [facility])  I-STAT TROPOININ, ED  CBG MONITORING, ED  CBG MONITORING, ED  I-STAT CHEM 8, ED    EKG  EKG  Interpretation  Date/Time:  Thursday December 23 2015 14:34:03 EDT Ventricular Rate:  70 PR Interval:    QRS Duration: 103 QT Interval:  404 QTC Calculation: 436 R Axis:   56 Text Interpretation:  Sinus rhythm Atrial premature complexes Left atrial enlargement ST elevation, consider inferior injury No previous ECGs available Confirmed by Haniel Fix MD, Boni Maclellan [phone]) on 12/23/2015 3:10:14 PM       Radiology Ct Head Code Stroke W/o Cm  Result Date: 12/23/2015 CLINICAL DATA:  Code stroke.  Left-sided weakness. EXAM: CT HEAD WITHOUT CONTRAST TECHNIQUE: Contiguous axial images were obtained from the base of the skull through the vertex without intravenous contrast. COMPARISON:  None. FINDINGS: Brain: No evidence of acute infarction, hemorrhage, hydrocephalus, extra-axial collection or mass lesion/mass effect. Mild chronic microvascular ischemic changes and parenchymal volume loss. Vascular: No hyperdense vessel identified. Calcific atherosclerosis of carotid siphons. Skull: Normal. Negative for fracture or focal lesion. Sinuses/Orbits: Visualized paranasal sinuses and mastoids are clear. Bilateral intra-ocular lens replacement. The external auditory canal opacification, likely cerumen. Other: None. ASPECTS [facility] Stroke Program Early CT Score) - Ganglionic level infarction (caudate, lentiform nuclei, internal capsule, insula, M1-M3 cortex): 7 - Supraganglionic infarction (M4-M6 cortex): 3 Total score (0-10 with 10 being normal): 10 IMPRESSION: 1. No acute intracranial abnormality identified. Mild chronic microvascular ischemic changes and parenchymal volume loss. 2. ASPECTS is 10 Electronically Signed   By: Kristine Garbe M.D.   On: 12/23/2015 15:23    Procedures Procedures (including critical care time)  Medications Ordered in ED Medications - No data to display   Initial Impression / Assessment and Plan / ED Course  I have reviewed the triage vital signs and the nursing  notes.  Pertinent labs & imaging results that were available during my care of the patient were reviewed by me and considered in my medical decision making (see chart for details).  Clinical Course   Patient presents after witnessed syncopal episode at home. On arrival to the ER she was awake and alert, bedside nurse noted left grip strength weakness and mild left foot weakness therefore a stroke alert was called on arrival. She was met by neurology team and taken to CT scanner where head CT was negative. Reexamination after CT showed normal neurologic exam with no focal deficits. She denied any complaints. EKG without acute ischemic changes, no previous available. Obtained above lab work which shows creatinine 1.5, BUN 25, normal troponin. Gave 1L IVF. Pt well appearing on re-examination w/ no complaints. Because of syncope given age and comorbidities, discussed admission with Dr. Shanon Brow and pt admitted for further care.  Final Clinical Impressions(s) / ED Diagnoses   Final diagnoses:  Syncope and collapse  AKI (acute kidney injury) [facility])    New Prescriptions New  Prescriptions   No medications on file     Sharlett Iles, MD 12/23/15 [phone]

## 2015-12-23 NOTE — ED Notes (Signed)
Pt assisted to w/c transferred without difficulty, pt transferred to toilet without difficulty.

## 2015-12-23 NOTE — ED Notes (Signed)
cbg was 112 

## 2015-12-23 NOTE — Code Documentation (Signed)
Pt is an 82 yo female who arrived via [facility] s/p syncopal episode at home. She was sitting in a chair at home and was found slumped over in herchair by her daughter. The pt had an episode of vomiting at home prior to EMS arrival. Upon assessment by ED RN, a LUE weakness was noted. Code Stroke was called. Stroke team responded and patient was taken for a STAT head CT. CT showed no acute intracranial abnormality. Mild chronic microvascular ischemic changes and parenchymal volume loss. ASPECTS is 10. NIHSS was completed. See documentation. No focal deficits on exam. Pt to be admitted as a TIA alert. Bedside handoff with ED RN Chrislyn.

## 2015-12-23 NOTE — Progress Notes (Signed)
Attempted report ED RN stated she will call back

## 2015-12-23 NOTE — H&P (Signed)
History and Physical    JONNAE FONSECA ZOX:[id number] DOB: 1925/05/02 DOA: 12/23/2015  PCP: No primary care provider on file.  Patient coming from: home  Chief Complaint:  Passed out  HPI: Yvette Lozano is a 82 y.o. female with medical history significant of CHF but she is unsure of what medical problems she has comes in with a snycopal episode.  She was sitting on her cough when she got tired and dozed off.  She says she fell asleep.  Her sister was with her and witnessed it.  She said she passed out and started to fall over and then came too a minute or so later.  There was no seizure activity.  Pt denies any prodrome.  She says she is always sob and has seen a doctor about this and does not know why.  She had no urine or bowel incontinence.  No recent illnesses.  She was quickly back to normal upon awakening.  She was a code stroke as there was some noted of weakness in her arm, but pt denies this.  She has no weakness anywhere right now, no numbness/tingling, no neurological symptoms.  Pt referred for admission for syncopal episode.  Review of Systems: As per HPI otherwise 10 point review of systems negative.   Past Medical History:  Diagnosis Date  . Arthritis   . CHF (congestive heart failure) (HCC)   . Hypertension     Past Surgical History:  Procedure Laterality Date  . ABDOMINAL HYSTERECTOMY       reports that she has never smoked. She has never used smokeless tobacco. She reports that she does not drink alcohol or use drugs.  Not on File  No family history on file.  Prior to Admission medications   Not on File    Physical Exam: Vitals:   12/23/15 1800 12/23/15 1828 12/23/15 1915 12/23/15 1945  BP: 116/67  117/58 117/63  Pulse: 82  89 88  Resp: 13  21 20   Temp:  98.1 F (36.7 C)    TempSrc:      SpO2: 97%  97% 95%  Weight:      Height:       Constitutional: NAD, calm, comfortable Vitals:   12/23/15 1800 12/23/15 1828 12/23/15 1915 12/23/15 1945  BP:  116/67  117/58 117/63  Pulse: 82  89 88  Resp: 13  21 20   Temp:  98.1 F (36.7 C)    TempSrc:      SpO2: 97%  97% 95%  Weight:      Height:       Eyes: PERRL, lids and conjunctivae normal ENMT: Mucous membranes are moist. Posterior pharynx clear of any exudate or lesions.Normal dentition.  Neck: normal, supple, no masses, no thyromegaly Respiratory: clear to auscultation bilaterally, no wheezing, no crackles. Normal respiratory effort. No accessory muscle use.  Cardiovascular: Regular rate and rhythm, no murmurs / rubs / gallops. No extremity edema. 2+ pedal pulses. No carotid bruits.  Abdomen: no tenderness, no masses palpated. No hepatosplenomegaly. Bowel sounds positive.  Musculoskeletal: no clubbing / cyanosis. No joint deformity upper and lower extremities. Good ROM, no contractures. Normal muscle tone.  Skin: no rashes, lesions, ulcers. No induration Neurologic: CN 2-12 grossly intact. Sensation intact, DTR normal. Strength 5/5 in all 4.  Psychiatric: Normal judgment and insight. Alert and oriented x 3. Normal mood.    Labs on Admission: I have personally reviewed following labs and imaging studies  CBC:  Recent Labs Lab 12/23/15 1455  12/23/15 1502  WBC 7.3  --   NEUTROABS 5.0  --   HGB 11.3* 11.9*  HCT 33.8* 35.0*  MCV 89.4  --   PLT 249  --    Basic Metabolic Panel:  Recent Labs Lab 12/23/15 1455 12/23/15 1502  NA 136 138  K 4.0 4.2  CL 106 106  CO2 19*  --   GLUCOSE 135* 134*  BUN 23* 25*  CREATININE 1.55* 1.50*  CALCIUM 9.1  --    GFR: Estimated Creatinine Clearance: 24.9 mL/min (by C-G formula based on SCr of 1.5 mg/dL (H)).  Coagulation Profile:  Recent Labs Lab 12/23/15 1453  INR 1.25    CBG:  Recent Labs Lab 12/23/15 1757  GLUCAP 112*    Urine analysis:    Component Value Date/Time   COLORURINE YELLOW 12/23/2015 1732   APPEARANCEUR CLOUDY (A) 12/23/2015 1732   LABSPEC 1.015 12/23/2015 1732   PHURINE 7.5 12/23/2015 1732    GLUCOSEU NEGATIVE 12/23/2015 1732   HGBUR NEGATIVE 12/23/2015 1732   BILIRUBINUR NEGATIVE 12/23/2015 1732   KETONESUR NEGATIVE 12/23/2015 1732   PROTEINUR NEGATIVE 12/23/2015 1732   NITRITE NEGATIVE 12/23/2015 1732   LEUKOCYTESUR NEGATIVE 12/23/2015 1732     Radiological Exams on Admission: Dg Chest Port 1 View  Result Date: 12/23/2015 CLINICAL DATA:  Syncope x3 today. EXAM: PORTABLE CHEST 1 VIEW COMPARISON:  None. FINDINGS: There is cardiomegaly without edema. No pneumothorax or pleural effusion. Degenerative change about the shoulders noted. IMPRESSION: Cardiomegaly without acute disease. Electronically Signed   By: Drusilla Kannerhomas  Dalessio M.D.   On: 12/23/2015 16:17   Ct Head Code Stroke W/o Cm  Addendum Date: 12/23/2015   ADDENDUM REPORT: 12/23/2015 15:42 ADDENDUM: These results were called by telephone at the time of interpretation on 12/23/2015 at 3:41 pm to Dr. Frederick PeersACHEL LITTLE , who verbally acknowledged these results. Electronically Signed   By: Mitzi HansenLance  Furusawa-Stratton M.D.   On: 12/23/2015 15:42   Result Date: 12/23/2015 CLINICAL DATA:  Code stroke.  Left-sided weakness. EXAM: CT HEAD WITHOUT CONTRAST TECHNIQUE: Contiguous axial images were obtained from the base of the skull through the vertex without intravenous contrast. COMPARISON:  None. FINDINGS: Brain: No evidence of acute infarction, hemorrhage, hydrocephalus, extra-axial collection or mass lesion/mass effect. Mild chronic microvascular ischemic changes and parenchymal volume loss. Vascular: No hyperdense vessel identified. Calcific atherosclerosis of carotid siphons. Skull: Normal. Negative for fracture or focal lesion. Sinuses/Orbits: Visualized paranasal sinuses and mastoids are clear. Bilateral intra-ocular lens replacement. The external auditory canal opacification, likely cerumen. Other: None. ASPECTS [facility] Stroke Program Early CT Score) - Ganglionic level infarction (caudate, lentiform nuclei, internal capsule, insula, M1-M3  cortex): 7 - Supraganglionic infarction (M4-M6 cortex): 3 Total score (0-10 with 10 being normal): 10 IMPRESSION: 1. No acute intracranial abnormality identified. Mild chronic microvascular ischemic changes and parenchymal volume loss. 2. ASPECTS is 10 Electronically Signed: By: Mitzi HansenLance  Furusawa-Stratton M.D. On: 12/23/2015 15:23    EKG: Independently reviewed. nsr no acute issues  Assessment/Plan 82 yo female with syncopal event  Principal Problem:   Syncope and collapse- full stroke work up per neuro team.  Mri brain, carotid dopplers, cardiac echo.  Monitor on telemetry for any significant arrythmias.  Serial troponin.  Check orthstatics.  Place on aspirin.  Active Problems:   Congestive heart failure (HCC)- noted   Hypertension- noted   DVT prophylaxis:  scds Code Status:  full Family Communication: sister  Disposition Plan:  Per day team Consults called:  neurology Admission status:  observation  DAVID,RACHAL A MD [facility]  If 7PM-7AM, please contact night-coverage [email or web address] Password [facility]  12/23/2015, 8:33 PM

## 2015-12-23 NOTE — Consult Note (Signed)
Requesting Physician: Dr. Clarene DukeLittle    Chief Complaint: Code stroke  History obtained from:  Nurse  HPI:                                                                                                                                         Yvette Lozano is an 82 y.o. female patient who arrived via EMS reporting witnessed syncopal episode the last 1-2 minutes. Per chart patient's daughter reported that she was lowered to the floor without any injury and then vomited. On arrival to the ED nurse at bedside was looking over patient and believe that she noted left upper extremity weakness. For this reason, stroke was called. Patient was rushed to the CT. CT head showed no intracranial abnormalities indicative of stroke or bleed. On arrival back to the room patient's symptoms had fully resolved.  Date last known well: Date: 12/23/2015 Time last known well: Time: 13:20 tPA Given: No: Symptoms resolved  Past Medical History:  Diagnosis Date  . Arthritis   . CHF (congestive heart failure) (HCC)   . Hypertension     Past Surgical History:  Procedure Laterality Date  . ABDOMINAL HYSTERECTOMY      No family history on file. Social History:  reports that she has never smoked. She has never used smokeless tobacco. She reports that she does not drink alcohol or use drugs.  Allergies: Not on File  Medications:                                                                                                                           No current facility-administered medications for this encounter.    No current outpatient prescriptions on file.     ROS:  History obtained from the patient  General ROS: negative for - chills, fatigue, fever, night sweats, weight gain or weight loss Psychological ROS: negative for - behavioral disorder, hallucinations, memory  difficulties, mood swings or suicidal ideation Ophthalmic ROS: negative for - blurry vision, double vision, eye pain or loss of vision ENT ROS: negative for - epistaxis, nasal discharge, oral lesions, sore throat, tinnitus or vertigo Allergy and Immunology ROS: negative for - hives or itchy/watery eyes Hematological and Lymphatic ROS: negative for - bleeding problems, bruising or swollen lymph nodes Endocrine ROS: negative for - galactorrhea, hair pattern changes, polydipsia/polyuria or temperature intolerance Respiratory ROS: negative for - cough, hemoptysis, shortness of breath or wheezing Cardiovascular ROS: negative for - chest pain, dyspnea on exertion, edema or irregular heartbeat Gastrointestinal ROS: negative for - abdominal pain, diarrhea, hematemesis, nausea/vomiting or stool incontinence Genito-Urinary ROS: negative for - dysuria, hematuria, incontinence or urinary frequency/urgency Musculoskeletal ROS: negative for - joint swelling or muscular weakness Neurological ROS: as noted in HPI Dermatological ROS: negative for rash and skin lesion changes  Neurologic Examination:                                                                                                      Blood pressure 121/79, pulse 82, temperature 98.1 F (36.7 C), temperature source Oral, resp. rate 17, height 5\' 3"  (1.6 m), weight 73.5 kg (162 lb), SpO2 98 %.  HEENT-  Normocephalic, no lesions, without obvious abnormality.  Normal external eye and conjunctiva.  Normal TM's bilaterally.  Normal auditory canals and external ears. Normal external nose, mucus membranes and septum.  Normal pharynx. Cardiovascular- S1, S2 normal, pulses palpable throughout   Lungs- chest clear, no wheezing, rales, normal symmetric air entry Abdomen- normal findings: bowel sounds normal Extremities- no edema Lymph-no adenopathy palpable Musculoskeletal-no joint tenderness, deformity or swelling Skin-warm and dry, no hyperpigmentation,  vitiligo, or suspicious lesions  Neurological Examination Mental Status: Alert, oriented, thought content appropriate.  Speech fluent without evidence of aphasia.  Able to follow 3 step commands without difficulty. Cranial Nerves: II:  Visual fields grossly normal, pupils equal, round, reactive to light and accommodation III,IV, VI: ptosis not present, extra-ocular motions intact bilaterally V,VII: smile symmetric, facial light touch sensation normal bilaterally VIII: hearing normal bilaterally IX,X: uvula rises symmetrically XI: bilateral shoulder shrug XII: midline tongue extension Motor: Right : Upper extremity   5/5    Left:     Upper extremity   5/5  Lower extremity   5/5     Lower extremity   5/5 --No drift nor pronation was noted Tone and bulk:normal tone throughout; no atrophy noted Sensory: Pinprick and light touch intact throughout, bilaterally Deep Tendon Reflexes: 1+ and symmetric throughout Plantars: Right: downgoing   Left: downgoing Cerebellar: normal finger-to-nose,and normal heel-to-shin test Gait: Not tested       Lab Results: Basic Metabolic Panel:  Recent Labs Lab 12/23/15 1502  NA 138  K 4.2  CL 106  GLUCOSE 134*  BUN 25*  CREATININE 1.50*    Liver Function Tests: No results for input(s): AST, ALT,  ALKPHOS, BILITOT, PROT, ALBUMIN in the last 168 hours. No results for input(s): LIPASE, AMYLASE in the last 168 hours. No results for input(s): AMMONIA in the last 168 hours.  CBC:  Recent Labs Lab 12/23/15 1502  HGB 11.9*  HCT 35.0*    Cardiac Enzymes: No results for input(s): CKTOTAL, CKMB, CKMBINDEX, TROPONINI in the last 168 hours.  Lipid Panel: No results for input(s): CHOL, TRIG, HDL, CHOLHDL, VLDL, LDLCALC in the last 168 hours.  CBG: No results for input(s): GLUCAP in the last 168 hours.  Microbiology: No results found for this or any previous visit.  Coagulation Studies: No results for input(s): LABPROT, INR in the last 72  hours.  Imaging: No results found.     Assessment and plan discussed with with attending physician and they are in agreement.    [name] PA-C [facility] [phone]  12/23/2015, 3:23 PM   Assessment: 82 y.o. female presenting to the [facility] department after syncopal episode at home. On arrival to the ED patient was noted to have left-sided weakness. Patient was having some difficulty with her blood pressure while in the ED with some low systolic blood pressures. On her right low back to the room patient symptoms had fully resolved. TPA was not administered secondary to resolution of symptoms. At this time cannot rule out TIA completely.  Stroke Risk Factors - hypertension  Recommend 1. HgbA1c, fasting lipid panel 2. MRI, MRA  of the brain without contrast 3. PT consult, OT consult, Speech consult 4. Echocardiogram 5. Carotid dopplers 6. Prophylactic therapy-Antiplatelet med: Aspirin - dose 325 mg daily 7. Risk factor modification 8. Telemetry monitoring 9. Frequent neuro checks 10 NPO until passes stroke swallow screen 11 please page stroke NP  Or  PA  Or MD from 8am -4 pm  as this patient from this time will be  followed by the stroke.   You can look them up on [email or web address]  Password [email or web address]

## 2015-12-23 NOTE — ED Triage Notes (Signed)
Pt arrives from home via [facility] reporting witnessed syncopal episode lasting 1-2 minutes.  Pt's daughter reports pt lowered from chair to floor without injury, reports pt vomited.  Pt denies CP, SOB, dizziness, fever/chills.  Pt AOx4 at this time.

## 2015-12-23 NOTE — Progress Notes (Signed)
Dr. Sharyon MedicusHijazi notified that patient passed the stroke swallow screen.  Heart healthy diet ordered.  MD also notified that patient's temp was 100 F. RN advised to monitor temp for now.  Will continue to monitor patient and notify MD as needed.

## 2015-12-24 ENCOUNTER — Observation Stay (HOSPITAL_BASED_OUTPATIENT_CLINIC_OR_DEPARTMENT_OTHER): Payer: Medicare Other

## 2015-12-24 ENCOUNTER — Observation Stay (HOSPITAL_COMMUNITY): Payer: Medicare Other

## 2015-12-24 DIAGNOSIS — R55 Syncope and collapse: Secondary | ICD-10-CM

## 2015-12-24 DIAGNOSIS — E785 Hyperlipidemia, unspecified: Secondary | ICD-10-CM | POA: Diagnosis not present

## 2015-12-24 DIAGNOSIS — R9431 Abnormal electrocardiogram [ECG] [EKG]: Secondary | ICD-10-CM

## 2015-12-24 DIAGNOSIS — G459 Transient cerebral ischemic attack, unspecified: Secondary | ICD-10-CM | POA: Diagnosis present

## 2015-12-24 DIAGNOSIS — I10 Essential (primary) hypertension: Secondary | ICD-10-CM

## 2015-12-24 DIAGNOSIS — I509 Heart failure, unspecified: Secondary | ICD-10-CM | POA: Diagnosis not present

## 2015-12-24 LAB — LIPID PANEL
CHOLESTEROL: 114 mg/dL (ref 0–200)
HDL: 43 mg/dL (ref >40)
LDL CALC: 62 mg/dL (ref 0–99)
TRIGLYCERIDES: 46 mg/dL (ref <150)
Total CHOL/HDL Ratio: 2.7 RATIO
VLDL: 9 mg/dL (ref 0–40)

## 2015-12-24 LAB — ECHOCARDIOGRAM COMPLETE
E decel time: 219 msec
EERAT: 7.21
FS: 28 % (ref 28–44)
HEIGHTINCHES: 65 in
IV/PV OW: 1.19
LA vol: 26.3 mL
LADIAMINDEX: 1.24 cm/m2
LASIZE: 22 mm
LAVOLA4C: 20.9 mL
LAVOLIN: 14.9 mL/m2
LDCA: 2.54 cm2
LEFT ATRIUM END SYS DIAM: 22 mm
LV E/e'average: 7.21
LVEEMED: 7.21
LVELAT: 9.68 cm/s
LVOT diameter: 18 mm
MV Dec: 219
MV pk E vel: 69.8 m/s
MVPKAVEL: 93.1 m/s
PW: 9.91 mm — AB (ref 0.6–1.1)
TDI e' lateral: 9.68
TDI e' medial: 9.14
WEIGHTICAEL: 2472.68 [oz_av]

## 2015-12-24 MED ORDER — SODIUM CHLORIDE 0.9 % IV SOLN
INTRAVENOUS | Status: DC
  Administered 2015-12-24 – 2015-12-25 (×2): via INTRAVENOUS

## 2015-12-24 MED ORDER — PREDNISONE 5 MG PO TABS
2.5000 mg | ORAL_TABLET | Freq: Every day | ORAL | Status: DC
  Administered 2015-12-24: 2.5 mg via ORAL
  Filled 2015-12-24: qty 1

## 2015-12-24 MED ORDER — ASPIRIN 81 MG PO CHEW
CHEWABLE_TABLET | ORAL | Status: AC
Start: 2015-12-24 — End: 2015-12-24
  Filled 2015-12-24: qty 1

## 2015-12-24 NOTE — Progress Notes (Signed)
SLP Cancellation Note  Patient Details Name: Lorenza Evangelistda M Maiorana MRN: [id number] DOB: 02-24-1926   Cancelled treatment:       Reason Eval/Treat Not Completed: Patient at procedure or test/unavailable   Seydina Holliman, Riley NearingBonnie Caroline 12/24/2015, 8:38 AM

## 2015-12-24 NOTE — Progress Notes (Signed)
[facility]                                                                              Patient Demographics  Yvette Lozano, is a 82 y.o. female, DOB - September 08, 1925, MWN:[id number]  Admit date - 12/23/2015   Admitting Physician Haydee Monica, MD  Outpatient Primary MD for the patient is No primary care provider on file.  Outpatient specialists:   LOS - 0  days    Chief Complaint  Patient presents with  . Loss of Consciousness       Brief summary   Yvette Lozano is a 82 y.o. female with medical history significant of CHF but she is unsure of what medical problems, Presented with witnessed a syncopal episode. In ED, there was some concern that patient may had some left arm weakness and "stroke was activated. Patient had an emergent CT scan of the head with stroke protocol which showed no acute abnormality. Neurology was consulted in ED.  For history, she was sitting on her couch when she got tired and dozed off.  She says she fell asleep, per patient's daughter she was lowered to the floor without any injury and then vomited, but family had a witnessed syncopal episode. Patient was admitted for syncope versus TIA workup.   Assessment & Plan    Principal Problem:   Syncope and collapseVersus TIA - CT head negative for acute CVA. Neurology was consulted and recommended TIA workup as well, patient had resolution of the symptoms in ED and hence not a TPA candidate. - MRI, MRA of the brain showed no acute stroke. MRI showed atrophy and small vessel disease with no acute intracranial findings. MRA showed Dolichoectasia. No intracranial flow-limiting stenosis or dissection. - 2-D echo, carotid Dopplers pending - LDL 62 - PTOT evaluation - Continue aspirin  Active Problems:   Hypertension - Continue to hold HCTZ, benazepril, antihypertensives - will restart gradually    TIA (transient ischemic attack)  Mild acute renal insufficiency - Continue to hold  benazepril, HCTZ - Patient received gentle hydration   Code Status: Full CODE STATUS DVT Prophylaxis:  SCD's Family Communication: Discussed in detail with the patient, all imaging results, lab results explained to the patient   Disposition Plan:   Time Spent in minutes  25 minutes  Procedures:  MRI, MRA brain   Consultants:   neuro  Antimicrobials:      Medications  Scheduled Meds: . aspirin      . aspirin  300 mg Rectal Daily   Or  . aspirin  325 mg Oral Daily   Continuous Infusions:  PRN Meds:.   Antibiotics   Anti-infectives    None        Subjective:   Yvette Lozano was seen and examined today. Patient denies dizziness, chest pain, shortness of breath, abdominal pain, N/V/D/C, new weakness, numbess, tingling. No acute events overnight.    Objective:   Vitals:   12/24/15 0421 12/24/15 0643 12/24/15 0827 12/24/15 1025  BP: (!) 114/53 (!) 105/59  (!) 112/49  Pulse: 95 81  87  Resp: 17 18  18   Temp: 97.7 F (  36.5 C) 98.9 F (37.2 C)  98.3 F (36.8 C)  TempSrc: Oral Oral  Oral  SpO2: 94% 99% 99% 98%  Weight:      Height:        Intake/Output Summary (Last 24 hours) at 12/24/15 1031 Last data filed at 12/24/15 0710  Gross per 24 hour  Intake             1000 ml  Output               50 ml  Net              950 ml     Wt Readings from Last 3 Encounters:  12/23/15 70.1 kg (154 lb 8.7 oz)     Exam  General: Alert and oriented, NAD  HEENT:  PERRLA, EOMI, Anicteric Sclera  Neck: Supple, no JVD,  Cardiovascular: S1 S2 auscultated, no rubs, murmurs or gallops. Regular rate and rhythm.  Respiratory: Clear to auscultation bilaterally, no wheezing, rales or rhonchi  Gastrointestinal: Soft, nontender, nondistended, + bowel sounds  Ext: no cyanosis clubbing or edema  Neuro: AAOx3, Cr N's II- XII. Strength 5/5 upper and lower extremities bilaterally  Skin: No rashes  Psych: Normal affect and demeanor, alert and oriented    Data  Reviewed:  I have personally reviewed following labs and imaging studies  Micro Results No results found for this or any previous visit (from the past 240 hour(s)).  Radiology Reports Mr Yvette LatchMra Head ZOWo Contrast  Result Date: 12/24/2015 CLINICAL DATA:  Syncopal episode at home. Patient quickly returned to baseline. No weakness or deficits. Code stroke was canceled. EXAM: MRI HEAD WITHOUT CONTRAST MRA HEAD WITHOUT CONTRAST TECHNIQUE: Multiplanar, multiecho pulse sequences of the brain and surrounding structures were obtained without intravenous contrast. Angiographic images of the head were obtained using MRA technique without contrast. COMPARISON:  CT head earlier today. FINDINGS: MRI HEAD FINDINGS Brain: No acute infarction, hemorrhage, hydrocephalus, extra-axial collection or mass lesion. Moderate atrophy. Mild chronic microvascular ischemic change. Vascular: Flow voids are maintained throughout the carotid, basilar, and vertebral arteries. There are no areas of chronic hemorrhage. Skull and upper cervical spine: Unremarkable visualized calvarium, skullbase, and cervical vertebrae. Partial empty sella. Pineal gland and cerebellar tonsils unremarkable. No upper cervical cord lesions. Sinuses/Orbits: No orbital masses or proptosis. Globes appear symmetric. Sinuses appear well aerated, without evidence for air-fluid level. BILATERAL cataract extraction. Other: None. MRA HEAD FINDINGS The internal carotid arteries are dolichoectatic but widely patent. No anterior circulation stenosis or occlusion of significance. Dolichoectatic vertebrobasilar system, with the LEFT vertebral dominant. "In-plane" saturation of flow along the distal V4 and proximal basilar segments,, without flow reducing stenosis or dissection. Poor visualization of lower cerebellar branches, but no PCA or SCA proximal disease. Fetal origin LEFT posterior cerebral artery. No saccular aneurysm. IMPRESSION: Atrophy and small vessel disease.  No  acute intracranial findings. Dolichoectasia. No intracranial flow-limiting stenosis or dissection. Electronically Signed   By: Elsie StainJohn T Curnes M.D.   On: 12/24/2015 09:32   Mr Brain Wo Contrast  Result Date: 12/24/2015 CLINICAL DATA:  Syncopal episode at home. Patient quickly returned to baseline. No weakness or deficits. Code stroke was canceled. EXAM: MRI HEAD WITHOUT CONTRAST MRA HEAD WITHOUT CONTRAST TECHNIQUE: Multiplanar, multiecho pulse sequences of the brain and surrounding structures were obtained without intravenous contrast. Angiographic images of the head were obtained using MRA technique without contrast. COMPARISON:  CT head earlier today. FINDINGS: MRI HEAD FINDINGS Brain: No acute infarction, hemorrhage, hydrocephalus, extra-axial collection  or mass lesion. Moderate atrophy. Mild chronic microvascular ischemic change. Vascular: Flow voids are maintained throughout the carotid, basilar, and vertebral arteries. There are no areas of chronic hemorrhage. Skull and upper cervical spine: Unremarkable visualized calvarium, skullbase, and cervical vertebrae. Partial empty sella. Pineal gland and cerebellar tonsils unremarkable. No upper cervical cord lesions. Sinuses/Orbits: No orbital masses or proptosis. Globes appear symmetric. Sinuses appear well aerated, without evidence for air-fluid level. BILATERAL cataract extraction. Other: None. MRA HEAD FINDINGS The internal carotid arteries are dolichoectatic but widely patent. No anterior circulation stenosis or occlusion of significance. Dolichoectatic vertebrobasilar system, with the LEFT vertebral dominant. "In-plane" saturation of flow along the distal V4 and proximal basilar segments,, without flow reducing stenosis or dissection. Poor visualization of lower cerebellar branches, but no PCA or SCA proximal disease. Fetal origin LEFT posterior cerebral artery. No saccular aneurysm. IMPRESSION: Atrophy and small vessel disease.  No acute intracranial  findings. Dolichoectasia. No intracranial flow-limiting stenosis or dissection. Electronically Signed   By: Elsie Stain M.D.   On: 12/24/2015 09:32   Dg Chest Port 1 View  Result Date: 12/23/2015 CLINICAL DATA:  Syncope x3 today. EXAM: PORTABLE CHEST 1 VIEW COMPARISON:  None. FINDINGS: There is cardiomegaly without edema. No pneumothorax or pleural effusion. Degenerative change about the shoulders noted. IMPRESSION: Cardiomegaly without acute disease. Electronically Signed   By: Drusilla Kanner M.D.   On: 12/23/2015 16:17   Ct Head Code Stroke W/o Cm  Addendum Date: 12/23/2015   ADDENDUM REPORT: 12/23/2015 15:42 ADDENDUM: These results were called by telephone at the time of interpretation on 12/23/2015 at 3:41 pm to Dr. Frederick Peers , who verbally acknowledged these results. Electronically Signed   By: Mitzi Hansen M.D.   On: 12/23/2015 15:42   Result Date: 12/23/2015 CLINICAL DATA:  Code stroke.  Left-sided weakness. EXAM: CT HEAD WITHOUT CONTRAST TECHNIQUE: Contiguous axial images were obtained from the base of the skull through the vertex without intravenous contrast. COMPARISON:  None. FINDINGS: Brain: No evidence of acute infarction, hemorrhage, hydrocephalus, extra-axial collection or mass lesion/mass effect. Mild chronic microvascular ischemic changes and parenchymal volume loss. Vascular: No hyperdense vessel identified. Calcific atherosclerosis of carotid siphons. Skull: Normal. Negative for fracture or focal lesion. Sinuses/Orbits: Visualized paranasal sinuses and mastoids are clear. Bilateral intra-ocular lens replacement. The external auditory canal opacification, likely cerumen. Other: None. ASPECTS [facility] Stroke Program Early CT Score) - Ganglionic level infarction (caudate, lentiform nuclei, internal capsule, insula, M1-M3 cortex): 7 - Supraganglionic infarction (M4-M6 cortex): 3 Total score (0-10 with 10 being normal): 10 IMPRESSION: 1. No acute intracranial  abnormality identified. Mild chronic microvascular ischemic changes and parenchymal volume loss. 2. ASPECTS is 10 Electronically Signed: By: Mitzi Hansen M.D. On: 12/23/2015 15:23    Lab Data:  CBC:  Recent Labs Lab 12/23/15 1455 12/23/15 1502  WBC 7.3  --   NEUTROABS 5.0  --   HGB 11.3* 11.9*  HCT 33.8* 35.0*  MCV 89.4  --   PLT 249  --    Basic Metabolic Panel:  Recent Labs Lab 12/23/15 1455 12/23/15 1502  NA 136 138  K 4.0 4.2  CL 106 106  CO2 19*  --   GLUCOSE 135* 134*  BUN 23* 25*  CREATININE 1.55* 1.50*  CALCIUM 9.1  --    GFR: Estimated Creatinine Clearance: 25.5 mL/min (by C-G formula based on SCr of 1.5 mg/dL (H)). Liver Function Tests: No results for input(s): AST, ALT, ALKPHOS, BILITOT, PROT, ALBUMIN in the last 168 hours. No results  for input(s): LIPASE, AMYLASE in the last 168 hours. No results for input(s): AMMONIA in the last 168 hours. Coagulation Profile:  Recent Labs Lab 12/23/15 1453  INR 1.25   Cardiac Enzymes: No results for input(s): CKTOTAL, CKMB, CKMBINDEX, TROPONINI in the last 168 hours. BNP (last 3 results) No results for input(s): PROBNP in the last 8760 hours. HbA1C: No results for input(s): HGBA1C in the last 72 hours. CBG:  Recent Labs Lab 12/23/15 1757  GLUCAP 112*   Lipid Profile:  Recent Labs  12/24/15 0535  CHOL 114  HDL 43  LDLCALC 62  TRIG 46  CHOLHDL 2.7   Thyroid Function Tests: No results for input(s): TSH, T4TOTAL, FREET4, T3FREE, THYROIDAB in the last 72 hours. Anemia Panel: No results for input(s): VITAMINB12, FOLATE, FERRITIN, TIBC, IRON, RETICCTPCT in the last 72 hours. Urine analysis:    Component Value Date/Time   COLORURINE YELLOW 12/23/2015 1732   APPEARANCEUR CLOUDY (A) 12/23/2015 1732   LABSPEC 1.015 12/23/2015 1732   PHURINE 7.5 12/23/2015 1732   GLUCOSEU NEGATIVE 12/23/2015 1732   HGBUR NEGATIVE 12/23/2015 1732   BILIRUBINUR NEGATIVE 12/23/2015 1732   KETONESUR  NEGATIVE 12/23/2015 1732   PROTEINUR NEGATIVE 12/23/2015 1732   NITRITE NEGATIVE 12/23/2015 1732   LEUKOCYTESUR NEGATIVE 12/23/2015 1732     Babacar Haycraft M.D. [facility] 12/24/2015, 10:31 AM  Pager: [phone] Between 7am to 7pm - call Pager - [phone]  After 7pm go to [email or web address] - password [email or web address]  Call night coverage person covering after 7pm

## 2015-12-24 NOTE — Evaluation (Signed)
Physical Therapy Evaluation Patient Details Name: Yvette Lozano MRN: [id number] DOB: Apr 24, 1925 Today's Date: 12/24/2015   History of Present Illness  Pt adm after witnessed syncope at home. In ED had transient lt arm weakness. CT negative. PMH - HTN, arthritis, chf  Clinical Impression  Pt admitted with above diagnosis and presents to PT with functional limitations due to deficits listed below (See PT problem list). Pt needs skilled PT to maximize independence and safety to allow discharge to home with family and HHPT. Feel pt is very close to her baseline.     Follow Up Recommendations Home health PT;Supervision - Intermittent    Equipment Recommendations  None recommended by PT    Recommendations for Other Services       Precautions / Restrictions Precautions Precautions: Fall Restrictions Weight Bearing Restrictions: No      Mobility  Bed Mobility Overal bed mobility: Modified Independent                Transfers Overall transfer level: Modified independent Equipment used: None Transfers: Sit to/from Stand Sit to Stand: Modified independent (Device/Increase time)            Ambulation/Gait Ambulation/Gait assistance: Supervision;Min guard Ambulation Distance (Feet): 200 Feet Assistive device: None;Rolling walker (2 wheeled) Gait Pattern/deviations: Step-through pattern;Decreased stride length;Trunk flexed   Gait velocity interpretation: at or above normal speed for age/gender General Gait Details: Pt able to amb in room without assistive device with supervision with occasional touching of furniture for reassurance. In the hallway pt used rolling walker with min guard assist and verbal cues to stay closer to the walker.  Stairs            Wheelchair Mobility    Modified Rankin (Stroke Patients Only)       Balance Overall balance assessment: Needs assistance Sitting-balance support: No upper extremity supported;Feet supported Sitting  balance-Leahy Scale: Good     Standing balance support: No upper extremity supported;During functional activity Standing balance-Leahy Scale: Fair                               Pertinent Vitals/Pain Pain Assessment: No/denies pain    Home Living Family/patient expects to be discharged to:: Private residence Living Arrangements: Children Available Help at Discharge: Family;Other (Comment) (most of the time) Type of Home: House Home Access: Stairs to enter   Enter[employer] CorporationEntrance Stairs-Number of Steps: 2 Home Layout: One level Home Equipment: Walker - 2 wheels;Cane - single point      Prior Function Level of Independence: Independent               Hand Dominance        Extremity/Trunk Assessment   Upper Extremity Assessment: Defer to OT evaluation           Lower Extremity Assessment: Overall WFL for tasks assessed         Communication   Communication: No difficulties  Cognition Arousal/Alertness: Awake/alert Behavior During Therapy: WFL for tasks assessed/performed Overall Cognitive Status: Within Functional Limits for tasks assessed                      General Comments      Exercises     Assessment/Plan    PT Assessment Patient needs continued PT services  PT Problem List Decreased balance;Decreased mobility;Decreased knowledge of use of DME          PT Treatment Interventions DME instruction;Gait training;Functional  mobility training;Therapeutic activities;Therapeutic exercise;Balance training;Patient/family education    PT Goals (Current goals can be found in the Care Plan section)  Acute Rehab PT Goals Patient Stated Goal: return home PT Goal Formulation: With patient Time For Goal Achievement: 12/31/15 Potential to Achieve Goals: Good    Frequency Min 3X/week   Barriers to discharge        Co-evaluation               End of Session Equipment Utilized During Treatment: Gait belt Activity Tolerance: Patient  tolerated treatment well Patient left: in chair;with call bell/phone within reach;with chair alarm set Nurse Communication: Mobility status    Functional Assessment Tool Used: clinical judgement Functional Limitation: Mobility: Walking and moving around Mobility: Walking and Moving Around Current Status [phone]): At least 1 percent but less than 20 percent impaired, limited or restricted Mobility: Walking and Moving Around Goal Status [phone]): 0 percent impaired, limited or restricted    Time: 1228-1243 PT Time Calculation (min) (ACUTE ONLY): 15 min   Charges:   PT Evaluation $PT Eval Moderate Complexity: 1 Procedure     PT G Codes:   PT G-Codes **NOT FOR INPATIENT CLASS** Functional Assessment Tool Used: clinical judgement Functional Limitation: Mobility: Walking and moving around Mobility: Walking and Moving Around Current Status ([id number]): At least 1 percent but less than 20 percent impaired, limited or restricted Mobility: Walking and Moving Around Goal Status [phone]): 0 percent impaired, limited or restricted    [facility] 12/24/2015, 2:45 PM [facility] PT [phone]

## 2015-12-24 NOTE — Progress Notes (Signed)
STROKE TEAM PROGRESS NOTE   HISTORY OF PRESENT ILLNESS (per record) Yvette Lozano is an 82 y.o. female patient who arrived via EMS with a reported witnessed syncopal episode that lasted 1-2 minutes. Per chart patient's daughter reported that she was lowered to the floor without any injury and then vomited. On arrival to the ED nurse at bedside was looking over patient and believe that she noted left upper extremity weakness. For this reason, a code stroke was called. The patient was rushed to the CT. CT head showed no intracranial abnormalities indicative of stroke or bleed. On arrival back to the room patient's symptoms had fully resolved.  Date last known well: Date: 12/23/2015 Time last known well: Time: 13:20 tPA Given: No: Symptoms resolved   SUBJECTIVE (INTERVAL HISTORY) Her PT is at the bedside.  Overall she feels her condition is completely resolved. She is asking for go home. She walks well but slow with walker with PT this morning. She can not remember what happen and she woke up in ambulance. Denies any shaking jerk or left UE weakness.    OBJECTIVE Temp:  [97.5 F (36.4 C)-100 F (37.8 C)] 98.3 F (36.8 C) (10/27 1025) Pulse Rate:  [70-99] 87 (10/27 1025) Cardiac Rhythm: Normal sinus rhythm (10/27 0700) Resp:  [13-21] 18 (10/27 1025) BP: (100-133)/(44-88) 112/49 (10/27 1025) SpO2:  [94 %-100 %] 98 % (10/27 1025) Weight:  [70.1 kg (154 lb 8.7 oz)-73.5 kg (162 lb)] 70.1 kg (154 lb 8.7 oz) (10/26 2137)  CBC:   Recent Labs Lab 12/23/15 1455 12/23/15 1502  WBC 7.3  --   NEUTROABS 5.0  --   HGB 11.3* 11.9*  HCT 33.8* 35.0*  MCV 89.4  --   PLT 249  --     Basic Metabolic Panel:   Recent Labs Lab 12/23/15 1455 12/23/15 1502  NA 136 138  K 4.0 4.2  CL 106 106  CO2 19*  --   GLUCOSE 135* 134*  BUN 23* 25*  CREATININE 1.55* 1.50*  CALCIUM 9.1  --     Lipid Panel:     Component Value Date/Time   CHOL 114 12/24/2015 0535   TRIG 46 12/24/2015 0535   HDL 43  12/24/2015 0535   CHOLHDL 2.7 12/24/2015 0535   VLDL 9 12/24/2015 0535   LDLCALC 62 12/24/2015 0535   HgbA1c: No results found for: HGBA1C Urine Drug Screen: No results found for: LABOPIA, COCAINSCRNUR, LABBENZ, AMPHETMU, THCU, LABBARB    IMAGING I have personally reviewed the radiological images below and agree with the radiology interpretations.  Dg Chest Port 1 View 12/23/2015 Cardiomegaly without acute disease.   Ct Head Code Stroke W/o Cm 12/23/2015   1. No acute intracranial abnormality identified. Mild chronic microvascular ischemic changes and parenchymal volume loss.  2. ASPECTS is 10  MR MRA Head Wo Contrast 12/24/2015 MRI - Atrophy and small vessel disease.  No acute intracranial findings. MRA - Dolichoectasia. No intracranial flow-limiting stenosis or dissection.  CUS pending  TTE pending  PHYSICAL EXAM HEENT-  Normocephalic, no lesions, without obvious abnormality.  Normal external eye and conjunctiva.  Normal TM's bilaterally.  Normal auditory canals and external ears. Normal external nose, mucus membranes and septum.  Normal pharynx. Cardiovascular- S1, S2 normal, pulses palpable throughout   Lungs- chest clear, no wheezing, rales, normal symmetric air entry Abdomen- normal findings: bowel sounds normal Extremities- no edema Lymph-no adenopathy palpable Musculoskeletal-no joint tenderness, deformity or swelling Skin-warm and dry, no hyperpigmentation, vitiligo, or suspicious lesions  Neurological Examination Mental Status: Alert, oriented to month and place, but not to year, thought content appropriate.  Speech fluent without evidence of aphasia. Naming 2/4, with intact repetition. Able to follow 3 step commands without difficulty. Cranial Nerves: II:  Visual fields grossly normal, pupils equal, round, reactive to light and accommodation III,IV, VI: ptosis not present, extra-ocular motions intact bilaterally V,VII: smile symmetric, facial light touch  sensation normal bilaterally VIII: hearing normal bilaterally IX,X: uvula rises symmetrically XI: bilateral shoulder shrug XII: midline tongue extension Motor: Right :  Upper extremity   5/5                                      Left:     Upper extremity   5/5             Lower extremity   5/5                                                  Lower extremity   5/5 --No drift nor pronation was noted Tone and bulk:normal tone throughout; no atrophy noted Sensory: Pinprick and light touch intact throughout, bilaterally Deep Tendon Reflexes: 1+ and symmetric throughout Plantars: Right: downgoing                                Left: downgoing Cerebellar: normal finger-to-nose,and normal heel-to-shin test Gait: Not tested   ASSESSMENT/PLAN Yvette Lozano is a 82 y.o. female with history of hypertension, congestive heart failure, and arthritis presenting with syncope and transient left upper extremity weakness. She did not receive IV t-PA due to resolution of deficits.  Syncope - etiology not clear, need carotid doppler to rule out right ICA stenosis given equivocal complain of left UE weakness.  Resultant resolution of deficits  MRI - Atrophy and small vessel disease.  No acute intracranial findings.  MRA - Dolichoectasia. No intracranial flow-limiting stenosis or dissection.  Carotid Doppler - pending  2D Echo - pending  LDL - 62  HgbA1c - pending  VTE prophylaxis - SCDs Diet Heart Room service appropriate? Yes; Fluid consistency: Thin  No antithrombotic prior to admission, now on aspirin 325 mg daily. Continue ASA on discharge.  Patient counseled to be compliant with her antithrombotic medications  Ongoing aggressive stroke risk factor management  Therapy recommendations:  pending  Disposition:  Pending  Hypertension  Blood pressure somewhat low at times.  Permissive hypertension (OK if < 220/120) but gradually normalize in 5-7 days  Long-term BP goal  normotensive  Hyperlipidemia  Home meds:  No lipid lowering medications prior to admission  LDL 62, goal < 70  At goal   Other Stroke Risk Factors  Advanced age   Other Active Problems  Renal insufficiency - 25 / 1.5  Mild anemia  Mildly low blood pressures - check orthostatic vital signs (currently off home BP meds)  Hospital day # 0  Marvel Plan, MD PhD Stroke Neurology 12/24/2015 11:29 PM    To contact Stroke Continuity provider, please refer to [email or web address]. After hours, contact General Neurology

## 2015-12-24 NOTE — Care Management Note (Signed)
Case Management Note  Patient Details  Name: Lorenza Evangelistda M Gao MRN: [id number] Date of Birth: 1926-02-02  Subjective/Objective:                 Pt in obs for TIA (stroke work up pending) lives at home with son, has cane, walker, shower chair, denies need for more DME. Patient states she has used [facility] in the past and would like to use them again if needed after DC.   Action/Plan:  CM will follow for DC needs.  Expected Discharge Date:                  Expected Discharge Plan:     In-House Referral:  NA  Discharge planning Services  CM Consult  Post Acute Care Choice:    Choice offered to:     DME Arranged:    DME Agency:     HH Arranged:    HH Agency:     Status of Service:  In process, will continue to follow  If discussed at Long Length of Stay Meetings, dates discussed:    Additional Comments:  Lawerance SabalDebbie Yasser Hepp, RN 12/24/2015, 11:31 AM

## 2015-12-24 NOTE — Evaluation (Signed)
Speech Language Pathology Evaluation Patient Details Name: Yvette Lozano MRN: [id number] DOB: [birth year] Today's Date: 12/24/2015 Time: [id number] SLP Time Calculation (min) (ACUTE ONLY): 13 min  Problem List:  Patient Active Problem List   Diagnosis Date Noted  . TIA (transient ischemic attack) 12/24/2015  . Syncope and collapse 12/23/2015  . Congestive heart failure (HCC)   . Hypertension    Past Medical History:  Past Medical History:  Diagnosis Date  . Arthritis   . CHF (congestive heart failure) (HCC)   . Hypertension    Past Surgical History:  Past Surgical History:  Procedure Laterality Date  . ABDOMINAL HYSTERECTOMY     HPI:  Yvette Lozano is an 82 y.o. female patient who arrived via EMS reporting witnessed syncopal episode the last 1-2 minutes. Per chart patient's daughter reported that she was lowered to the floor without any injury and then vomited. On arrival to the ED nurse at bedside was looking over patient and believe that she noted left upper extremity weakness. For this reason, stroke was called. Patient was rushed to the CT. CT head showed no intracranial abnormalities indicative of stroke or bleed. On arrival back to the room patient's symptoms had fully resolved.   Assessment / Plan / Recommendation Clinical Impression  Pt demonstrates mild cognitive and linguistic impariment, likely age related and exsisting at baseline. Pt often hesitates in conversation for word finding, though this could be memory or attention related. Needs multiple repetitions for learning basic tasks and is disoriented to time suggesting moderate memory deficits. She denies baseline deficits but does report that her daughter manages her medications and bills and her son does shopping. Pt will not need SLP f/u after d/c given probably of baseline impairment that family is managing with adequate supervision and assist. Will sign off.     SLP Assessment  All further Speech Lanaguage  Pathology  needs can be addressed in the next venue of care    Follow Up Recommendations  None    Frequency and Duration           SLP Evaluation Cognition  Overall Cognitive Status: History of cognitive impairments - at baseline Arousal/Alertness: Awake/alert Orientation Level: Oriented to person;Oriented to place;Disoriented to time;Oriented to situation Attention: Focused;Sustained;Alternating;Selective Focused Attention: Appears intact Sustained Attention: Appears intact Selective Attention: Impaired Selective Attention Impairment: Verbal basic;Functional basic Alternating Attention: Impaired Alternating Attention Impairment: Verbal basic;Functional basic Memory: Impaired Memory Impairment: Storage deficit Problem Solving: Impaired Problem Solving Impairment: Verbal basic;Functional basic Safety/Judgment: Impaired       Comprehension  Auditory Comprehension Overall Auditory Comprehension: Appears within functional limits for tasks assessed    Expression Expression Primary Mode of Expression: Verbal Verbal Expression Overall Verbal Expression: Impaired at baseline Initiation: No impairment Automatic Speech: Name;Social Response Level of Generative/Spontaneous Verbalization: Sentence;Conversation Repetition: Impaired Level of Impairment: Sentence level Naming: No impairment Pragmatics: No impairment Interfering Components: Speech intelligibility;Attention   Oral / Motor  Oral Motor/Sensory Function Overall Oral Motor/Sensory Function: Within functional limits Motor Speech Overall Motor Speech: Appears within functional limits for tasks assessed   GO                    Marshawn Normoyle, Riley NearingBonnie Caroline 12/24/2015, 3:18 PM

## 2015-12-24 NOTE — Progress Notes (Signed)
*  PRELIMINARY RESULTS* Vascular Ultrasound Carotid Duplex (Doppler) has been completed.  Preliminary findings: Bilateral 1-39% ICA stenosis, antegrade vertebral flow. Tortuous left sided vessels.   Yvette FischerCharlotte C Leoma Folds 12/24/2015, 2:19 PM

## 2015-12-24 NOTE — Progress Notes (Signed)
OT Cancellation Note  Patient Details Name: Yvette Lozano MRN: [id number] DOB: [birth year]   Cancelled Treatment:    Reason Eval/Treat Not Completed: Patient at procedure or test/ unavailable.  Will reattempt.  Javarri Segal [place], OTR/L [phone]   Jeani HawkingConarpe, Sherica Paternostro M 12/24/2015, 2:18 PM

## 2015-12-24 NOTE — Progress Notes (Signed)
  Echocardiogram 2D Echocardiogram has been performed.  Delcie RochENNINGTON, Deandrea Vanpelt 12/24/2015, 4:33 PM

## 2015-12-24 NOTE — Care Management Obs Status (Signed)
MEDICARE OBSERVATION STATUS NOTIFICATION   Patient Details  Name: Yvette Lozano MRN: [id number] Date of Birth: 1925/11/28   Medicare Observation Status Notification Given:  Yes    Lawerance Sabalebbie Mykeria Garman, RN 12/24/2015, 11:31 AM

## 2015-12-25 DIAGNOSIS — G459 Transient cerebral ischemic attack, unspecified: Secondary | ICD-10-CM | POA: Diagnosis not present

## 2015-12-25 DIAGNOSIS — I10 Essential (primary) hypertension: Secondary | ICD-10-CM | POA: Diagnosis not present

## 2015-12-25 DIAGNOSIS — I509 Heart failure, unspecified: Secondary | ICD-10-CM | POA: Diagnosis not present

## 2015-12-25 DIAGNOSIS — E785 Hyperlipidemia, unspecified: Secondary | ICD-10-CM

## 2015-12-25 DIAGNOSIS — R55 Syncope and collapse: Secondary | ICD-10-CM | POA: Diagnosis not present

## 2015-12-25 LAB — VAS US CAROTID
LCCADSYS: -66 cm/s
LEFT VERTEBRAL DIAS: 8 cm/s
LICADDIAS: -23 cm/s
LICAPDIAS: 15 cm/s
Left CCA dist dias: -9 cm/s
Left CCA prox dias: -10 cm/s
Left CCA prox sys: -71 cm/s
Left ICA dist sys: -87 cm/s
Left ICA prox sys: 71 cm/s
RCCADSYS: -51 cm/s
RCCAPDIAS: 13 cm/s
RIGHT ECA DIAS: -8 cm/s
RIGHT VERTEBRAL DIAS: -6 cm/s
Right CCA prox sys: 106 cm/s

## 2015-12-25 LAB — HEMOGLOBIN A1C
HEMOGLOBIN A1C: 5.8 % — AB (ref 4.8–5.6)
Mean Plasma Glucose: 120 mg/dL

## 2015-12-25 LAB — BASIC METABOLIC PANEL
ANION GAP: 9 (ref 5–15)
BUN: 12 mg/dL (ref 6–20)
CALCIUM: 8.7 mg/dL — AB (ref 8.9–10.3)
CO2: 20 mmol/L — ABNORMAL LOW (ref 22–32)
Chloride: 110 mmol/L (ref 101–111)
Creatinine, Ser: 1.04 mg/dL — ABNORMAL HIGH (ref 0.44–1.00)
GFR, EST AFRICAN AMERICAN: 54 mL/min — AB (ref >60)
GFR, EST NON AFRICAN AMERICAN: 47 mL/min — AB (ref >60)
Glucose, Bld: 109 mg/dL — ABNORMAL HIGH (ref 65–99)
Potassium: 3.7 mmol/L (ref 3.5–5.1)
Sodium: 139 mmol/L (ref 135–145)

## 2015-12-25 MED ORDER — AMLODIPINE BESY-BENAZEPRIL HCL 10-20 MG PO CAPS: 1.0000 | ORAL_CAPSULE | Freq: Every day | ORAL | Status: DC

## 2015-12-25 MED ORDER — ASPIRIN EC 81 MG PO TBEC: 81.0000 mg | DELAYED_RELEASE_TABLET | Freq: Every day | ORAL | 3 refills | Status: AC

## 2015-12-25 NOTE — Progress Notes (Signed)
[facility].  Pt states she has both a rolling walker and a 3n1 at home.  [facility] Jermaine.  No other CM needs were communicated.

## 2015-12-25 NOTE — Progress Notes (Signed)
Orthostatic Vital Signs   Lying:  Bp: 105/61  Hr: 90   Sitting: Bp: 125/75 Hr: 98  Standing:  Bp: 117/69   Hr: 108

## 2015-12-25 NOTE — Progress Notes (Signed)
Yvette EvangelistIda M Lozano to be D/Lozano'd Home per MD order.  Discussed with the patient and all questions fully answered.  VSS, Skin clean, dry and intact without evidence of skin break down, no evidence of skin tears noted. IV catheter discontinued intact. Site without signs and symptoms of complications. Dressing and pressure applied.  An After Visit Summary was printed and given to the patient. Patient received prescription.  D/Lozano education completed with patient/family including follow up instructions, medication list, d/Lozano activities limitations if indicated, with other d/Lozano instructions as indicated by MD - patient able to verbalize understanding, all questions fully answered.   Patient instructed to return to ED, call 911, or call MD for any changes in condition.   Patient escorted via WC, and D/Lozano home via private auto.  Yvette Lozano, Yvette Lozano 12/25/2015 10:04 AM

## 2015-12-25 NOTE — Discharge Summary (Signed)
Physician Discharge Summary   Patient ID: Yvette Lozano MRN: [id number] DOB/AGE: [birth year] 82 y.o.  Admit date: 12/23/2015 Discharge date: 12/25/2015  Primary Care Physician:  Geraldo Pitter, MD  Discharge Diagnoses:    . Syncope and collapse . Essential hypertension . Probable TIA (transient ischemic attack)   Consults: Neurology  Recommendations for Outpatient Follow-up:  1. Hydrochlorothiazide has been discontinued, currently Lotrel on hold until follow-up with PCP, explained to the patient's daughter 2. Please repeat CBC/BMET at next visit   DIET: Heart healthy diet    Allergies:  No Known Allergies   DISCHARGE MEDICATIONS: Current Discharge Medication List    START taking these medications   Details  aspirin EC 81 MG tablet Take 1 tablet (81 mg total) by mouth daily. Qty: 30 tablet, Refills: 3      CONTINUE these medications which have CHANGED   Details  amLODipine-benazepril (LOTREL) 10-20 MG capsule Take 1 capsule by mouth daily. HOLD until follow-up with your doctor      CONTINUE these medications which have NOT CHANGED   Details  carvedilol (COREG) 25 MG tablet Take 25 mg by mouth 2 (two) times daily.    HYDROcodone-acetaminophen (NORCO/VICODIN) 5-325 MG tablet Take 1 tablet by mouth See admin instructions. Every 8-12 hours as needed for pain    Multiple Vitamins-Minerals (ONE-A-DAY WOMENS 50+ ADVANTAGE PO) Take 1 tablet by mouth daily after breakfast.    predniSONE (DELTASONE) 2.5 MG tablet Take 2.5 mg by mouth daily.      STOP taking these medications     hydrochlorothiazide (HYDRODIURIL) 25 MG tablet          Brief H and P: For complete details please refer to admission H and P, but in brief Yvette Lozano a 82 y.o.femalewith medical history significant of CHF but she is unsure of what medical problems, Presented with witnessed a syncopal episode. In ED, there was some concern that patient may had some left arm weakness and "stroke  was activated. Patient had an emergent CT scan of the head with stroke protocol which showed no acute abnormality. Neurology was consulted in ED.  For history, she was sitting on her couch when she got tired and dozed off. She says she fell asleep, per patient's daughter she was lowered to the floor without any injury and then vomited, but family had a witnessed syncopal episode. Patient was admitted for syncope versus TIA workup.  Hospital Course:  Syncope and collapse Versus TIA - CT head negative for acute CVA. Neurology was consulted and recommended TIA workup as well, patient had resolution of the symptoms in ED and hence not a TPA candidate. - MRI, MRA of the brain showed no acute stroke. MRI showed atrophy and small vessel disease with no acute intracranial findings. MRA showed Dolichoectasia. No intracranial flow-limiting stenosis or dissection. - 2-D echo showed EF of 60-65%, no wall motion abnormalities. Carotid Dopplers showed 1-39% ICA stenosis bilaterally - LDL 62 - PTOT evaluation recommended home health PT - Continue aspirin    Hypertension - Continue to hold HCTZ, benazepril, amlodipine  - During hospitalization patient was noted to have soft BP and was not placed on any antihypertensives. She may restart beta blocker due to concern of withdrawals. However hold HCTZ and Lotrel until follow-up with PCP. This was explained to patient's daughter on the phone.   Mild acute renal insufficiency - Continue to hold benazepril, HCTZ - Patient received gentle hydration, creatinine improved to 1.0 from 1.5 at the time of  admission   Day of Discharge BP 105/67 (BP Location: Left Arm)   Pulse 90   Temp 98.9 F (37.2 C) (Oral)   Resp 18   Ht 5\' 5"  (1.651 m)   Wt 73 kg (160 lb 15 oz)   SpO2 95%   BMI 26.78 kg/m   Physical Exam: General: Alert and awake oriented x3 not in any acute distress. HEENT: anicteric sclera, pupils reactive to light and accommodation CVS: S1-S2 clear  no murmur rubs or gallops Chest: clear to auscultation bilaterally, no wheezing rales or rhonchi Abdomen: soft nontender, nondistended, normal bowel sounds Extremities: no cyanosis, clubbing or edema noted bilaterally Neuro: Cranial nerves II-XII intact, no focal neurological deficits   The results of significant diagnostics from this hospitalization (including imaging, microbiology, ancillary and laboratory) are listed below for reference.    LAB RESULTS: Basic Metabolic Panel:  Recent Labs Lab 12/23/15 1455 12/23/15 1502 12/25/15 0815  NA 136 138 139  K 4.0 4.2 3.7  CL 106 106 110  CO2 19*  --  20*  GLUCOSE 135* 134* 109*  BUN 23* 25* 12  CREATININE 1.55* 1.50* 1.04*  CALCIUM 9.1  --  8.7*   Liver Function Tests: No results for input(s): AST, ALT, ALKPHOS, BILITOT, PROT, ALBUMIN in the last 168 hours. No results for input(s): LIPASE, AMYLASE in the last 168 hours. No results for input(s): AMMONIA in the last 168 hours. CBC:  Recent Labs Lab 12/23/15 1455 12/23/15 1502  WBC 7.3  --   NEUTROABS 5.0  --   HGB 11.3* 11.9*  HCT 33.8* 35.0*  MCV 89.4  --   PLT 249  --    Cardiac Enzymes: No results for input(s): CKTOTAL, CKMB, CKMBINDEX, TROPONINI in the last 168 hours. BNP: Invalid input(s): POCBNP CBG:  Recent Labs Lab 12/23/15 1757  GLUCAP 112*    Significant Diagnostic Studies:  Mr Shirlee Latch ZO Contrast  Result Date: 12/24/2015 CLINICAL DATA:  Syncopal episode at home. Patient quickly returned to baseline. No weakness or deficits. Code stroke was canceled. EXAM: MRI HEAD WITHOUT CONTRAST MRA HEAD WITHOUT CONTRAST TECHNIQUE: Multiplanar, multiecho pulse sequences of the brain and surrounding structures were obtained without intravenous contrast. Angiographic images of the head were obtained using MRA technique without contrast. COMPARISON:  CT head earlier today. FINDINGS: MRI HEAD FINDINGS Brain: No acute infarction, hemorrhage, hydrocephalus, extra-axial  collection or mass lesion. Moderate atrophy. Mild chronic microvascular ischemic change. Vascular: Flow voids are maintained throughout the carotid, basilar, and vertebral arteries. There are no areas of chronic hemorrhage. Skull and upper cervical spine: Unremarkable visualized calvarium, skullbase, and cervical vertebrae. Partial empty sella. Pineal gland and cerebellar tonsils unremarkable. No upper cervical cord lesions. Sinuses/Orbits: No orbital masses or proptosis. Globes appear symmetric. Sinuses appear well aerated, without evidence for air-fluid level. BILATERAL cataract extraction. Other: None. MRA HEAD FINDINGS The internal carotid arteries are dolichoectatic but widely patent. No anterior circulation stenosis or occlusion of significance. Dolichoectatic vertebrobasilar system, with the LEFT vertebral dominant. "In-plane" saturation of flow along the distal V4 and proximal basilar segments,, without flow reducing stenosis or dissection. Poor visualization of lower cerebellar branches, but no PCA or SCA proximal disease. Fetal origin LEFT posterior cerebral artery. No saccular aneurysm. IMPRESSION: Atrophy and small vessel disease.  No acute intracranial findings. Dolichoectasia. No intracranial flow-limiting stenosis or dissection. Electronically Signed   By: Elsie Stain M.D.   On: 12/24/2015 09:32   Mr Brain Wo Contrast  Result Date: 12/24/2015 CLINICAL DATA:  Syncopal  episode at home. Patient quickly returned to baseline. No weakness or deficits. Code stroke was canceled. EXAM: MRI HEAD WITHOUT CONTRAST MRA HEAD WITHOUT CONTRAST TECHNIQUE: Multiplanar, multiecho pulse sequences of the brain and surrounding structures were obtained without intravenous contrast. Angiographic images of the head were obtained using MRA technique without contrast. COMPARISON:  CT head earlier today. FINDINGS: MRI HEAD FINDINGS Brain: No acute infarction, hemorrhage, hydrocephalus, extra-axial collection or mass  lesion. Moderate atrophy. Mild chronic microvascular ischemic change. Vascular: Flow voids are maintained throughout the carotid, basilar, and vertebral arteries. There are no areas of chronic hemorrhage. Skull and upper cervical spine: Unremarkable visualized calvarium, skullbase, and cervical vertebrae. Partial empty sella. Pineal gland and cerebellar tonsils unremarkable. No upper cervical cord lesions. Sinuses/Orbits: No orbital masses or proptosis. Globes appear symmetric. Sinuses appear well aerated, without evidence for air-fluid level. BILATERAL cataract extraction. Other: None. MRA HEAD FINDINGS The internal carotid arteries are dolichoectatic but widely patent. No anterior circulation stenosis or occlusion of significance. Dolichoectatic vertebrobasilar system, with the LEFT vertebral dominant. "In-plane" saturation of flow along the distal V4 and proximal basilar segments,, without flow reducing stenosis or dissection. Poor visualization of lower cerebellar branches, but no PCA or SCA proximal disease. Fetal origin LEFT posterior cerebral artery. No saccular aneurysm. IMPRESSION: Atrophy and small vessel disease.  No acute intracranial findings. Dolichoectasia. No intracranial flow-limiting stenosis or dissection. Electronically Signed   By: Elsie StainJohn T Curnes M.D.   On: 12/24/2015 09:32   Dg Chest Port 1 View  Result Date: 12/23/2015 CLINICAL DATA:  Syncope x3 today. EXAM: PORTABLE CHEST 1 VIEW COMPARISON:  None. FINDINGS: There is cardiomegaly without edema. No pneumothorax or pleural effusion. Degenerative change about the shoulders noted. IMPRESSION: Cardiomegaly without acute disease. Electronically Signed   By: Drusilla Kannerhomas  Dalessio M.D.   On: 12/23/2015 16:17   Ct Head Code Stroke W/o Cm  Addendum Date: 12/23/2015   ADDENDUM REPORT: 12/23/2015 15:42 ADDENDUM: These results were called by telephone at the time of interpretation on 12/23/2015 at 3:41 pm to Dr. Frederick PeersACHEL LITTLE , who verbally  acknowledged these results. Electronically Signed   By: Mitzi HansenLance  Furusawa-Stratton M.D.   On: 12/23/2015 15:42   Result Date: 12/23/2015 CLINICAL DATA:  Code stroke.  Left-sided weakness. EXAM: CT HEAD WITHOUT CONTRAST TECHNIQUE: Contiguous axial images were obtained from the base of the skull through the vertex without intravenous contrast. COMPARISON:  None. FINDINGS: Brain: No evidence of acute infarction, hemorrhage, hydrocephalus, extra-axial collection or mass lesion/mass effect. Mild chronic microvascular ischemic changes and parenchymal volume loss. Vascular: No hyperdense vessel identified. Calcific atherosclerosis of carotid siphons. Skull: Normal. Negative for fracture or focal lesion. Sinuses/Orbits: Visualized paranasal sinuses and mastoids are clear. Bilateral intra-ocular lens replacement. The external auditory canal opacification, likely cerumen. Other: None. ASPECTS [facility]([place] Stroke Program Early CT Score) - Ganglionic level infarction (caudate, lentiform nuclei, internal capsule, insula, M1-M3 cortex): 7 - Supraganglionic infarction (M4-M6 cortex): 3 Total score (0-10 with 10 being normal): 10 IMPRESSION: 1. No acute intracranial abnormality identified. Mild chronic microvascular ischemic changes and parenchymal volume loss. 2. ASPECTS is 10 Electronically Signed: By: Mitzi HansenLance  Furusawa-Stratton M.D. On: 12/23/2015 15:23    2D ECHO: Study Conclusions  - Left ventricle: The cavity size was normal. Wall thickness was   increased in a pattern of mild LVH. Systolic function was normal.   The estimated ejection fraction was in the range of 60% to 65%.   Doppler parameters are consistent with abnormal left ventricular   relaxation (grade 1 diastolic dysfunction).  Disposition and Follow-up: Discharge Instructions    Diet - low sodium heart healthy    Complete by:  As directed    Discharge instructions    Complete by:  As directed    Please STOP hydrochlorothiazide. HOLD Lotrel until  follow up with your doctor next week. Check your BP daily. Please follow-up with your primary physician asap.   Increase activity slowly    Complete by:  As directed        DISPOSITION: home    DISCHARGE FOLLOW-UP Follow-up Information    Geraldo PitterBLAND,VEITA J, MD. Schedule an appointment as soon as possible for a visit in 10 day(s).   Specialty:  Family Medicine Contact information: [address] [place] [address] [phone]        [facility] .   Why:  home health physical and occupational therapy Contact information: [address] [place] [address] [phone]            Time spent on Discharge: 25mins   Signed:   Neeta Storey M.D. [facility] 12/25/2015, 9:47 AM Pager: [phone]

## 2016-01-07 DIAGNOSIS — I10 Essential (primary) hypertension: Secondary | ICD-10-CM | POA: Diagnosis not present

## 2016-01-07 DIAGNOSIS — E782 Mixed hyperlipidemia: Secondary | ICD-10-CM | POA: Diagnosis not present

## 2016-01-07 DIAGNOSIS — R55 Syncope and collapse: Secondary | ICD-10-CM | POA: Diagnosis not present

## 2016-01-26 DIAGNOSIS — M1991 Primary osteoarthritis, unspecified site: Secondary | ICD-10-CM | POA: Diagnosis not present

## 2016-01-26 DIAGNOSIS — I509 Heart failure, unspecified: Secondary | ICD-10-CM | POA: Diagnosis not present

## 2016-01-26 DIAGNOSIS — I10 Essential (primary) hypertension: Secondary | ICD-10-CM | POA: Diagnosis not present

## 2016-01-26 DIAGNOSIS — R2689 Other abnormalities of gait and mobility: Secondary | ICD-10-CM | POA: Diagnosis not present

## 2016-02-09 DIAGNOSIS — M1991 Primary osteoarthritis, unspecified site: Secondary | ICD-10-CM | POA: Diagnosis not present

## 2016-02-09 DIAGNOSIS — E785 Hyperlipidemia, unspecified: Secondary | ICD-10-CM | POA: Diagnosis not present

## 2016-02-09 DIAGNOSIS — E782 Mixed hyperlipidemia: Secondary | ICD-10-CM | POA: Diagnosis not present

## 2016-02-09 DIAGNOSIS — I509 Heart failure, unspecified: Secondary | ICD-10-CM | POA: Diagnosis not present

## 2016-03-24 DIAGNOSIS — E782 Mixed hyperlipidemia: Secondary | ICD-10-CM | POA: Diagnosis not present

## 2016-03-24 DIAGNOSIS — I10 Essential (primary) hypertension: Secondary | ICD-10-CM | POA: Diagnosis not present

## 2016-03-24 DIAGNOSIS — E0800 Diabetes mellitus due to underlying condition with hyperosmolarity without nonketotic hyperglycemic-hyperosmolar coma (NKHHC): Secondary | ICD-10-CM | POA: Diagnosis not present

## 2016-03-24 DIAGNOSIS — E785 Hyperlipidemia, unspecified: Secondary | ICD-10-CM | POA: Diagnosis not present

## 2016-04-28 DIAGNOSIS — R2689 Other abnormalities of gait and mobility: Secondary | ICD-10-CM | POA: Diagnosis not present

## 2016-04-28 DIAGNOSIS — E782 Mixed hyperlipidemia: Secondary | ICD-10-CM | POA: Diagnosis not present

## 2016-04-28 DIAGNOSIS — M1991 Primary osteoarthritis, unspecified site: Secondary | ICD-10-CM | POA: Diagnosis not present

## 2016-04-28 DIAGNOSIS — E118 Type 2 diabetes mellitus with unspecified complications: Secondary | ICD-10-CM | POA: Diagnosis not present

## 2016-07-28 DIAGNOSIS — M1991 Primary osteoarthritis, unspecified site: Secondary | ICD-10-CM | POA: Diagnosis not present

## 2016-07-28 DIAGNOSIS — I110 Hypertensive heart disease with heart failure: Secondary | ICD-10-CM | POA: Diagnosis not present

## 2016-07-28 DIAGNOSIS — E0800 Diabetes mellitus due to underlying condition with hyperosmolarity without nonketotic hyperglycemic-hyperosmolar coma (NKHHC): Secondary | ICD-10-CM | POA: Diagnosis not present

## 2016-07-28 DIAGNOSIS — I10 Essential (primary) hypertension: Secondary | ICD-10-CM | POA: Diagnosis not present

## 2016-10-26 DIAGNOSIS — Z0000 Encounter for general adult medical examination without abnormal findings: Secondary | ICD-10-CM | POA: Diagnosis not present

## 2016-10-26 DIAGNOSIS — M1991 Primary osteoarthritis, unspecified site: Secondary | ICD-10-CM | POA: Diagnosis not present

## 2016-10-26 DIAGNOSIS — E782 Mixed hyperlipidemia: Secondary | ICD-10-CM | POA: Diagnosis not present

## 2016-11-17 DIAGNOSIS — R2689 Other abnormalities of gait and mobility: Secondary | ICD-10-CM | POA: Diagnosis not present

## 2016-11-17 DIAGNOSIS — I10 Essential (primary) hypertension: Secondary | ICD-10-CM | POA: Diagnosis not present

## 2016-11-17 DIAGNOSIS — M1991 Primary osteoarthritis, unspecified site: Secondary | ICD-10-CM | POA: Diagnosis not present

## 2016-12-27 DIAGNOSIS — H353131 Nonexudative age-related macular degeneration, bilateral, early dry stage: Secondary | ICD-10-CM | POA: Diagnosis not present

## 2016-12-27 DIAGNOSIS — H04123 Dry eye syndrome of bilateral lacrimal glands: Secondary | ICD-10-CM | POA: Diagnosis not present

## 2016-12-27 DIAGNOSIS — H40013 Open angle with borderline findings, low risk, bilateral: Secondary | ICD-10-CM | POA: Diagnosis not present

## 2016-12-27 DIAGNOSIS — H18413 Arcus senilis, bilateral: Secondary | ICD-10-CM | POA: Diagnosis not present

## 2016-12-27 DIAGNOSIS — H40003 Preglaucoma, unspecified, bilateral: Secondary | ICD-10-CM | POA: Diagnosis not present

## 2017-03-07 DIAGNOSIS — R2689 Other abnormalities of gait and mobility: Secondary | ICD-10-CM | POA: Diagnosis not present

## 2017-03-07 DIAGNOSIS — I10 Essential (primary) hypertension: Secondary | ICD-10-CM | POA: Diagnosis not present

## 2017-03-07 DIAGNOSIS — M1991 Primary osteoarthritis, unspecified site: Secondary | ICD-10-CM | POA: Diagnosis not present

## 2017-03-07 DIAGNOSIS — E782 Mixed hyperlipidemia: Secondary | ICD-10-CM | POA: Diagnosis not present

## 2017-07-05 ENCOUNTER — Ambulatory Visit
Admission: RE | Admit: 2017-07-05 | Discharge: 2017-07-05 | Disposition: A | Discharge status: Home / Self Care | Payer: Medicare Other | Source: Ambulatory Visit | Attending: Family Medicine | Admitting: Family Medicine

## 2017-07-05 ENCOUNTER — Other Ambulatory Visit: Payer: Self-pay | Admitting: Family Medicine

## 2017-07-05 DIAGNOSIS — I10 Essential (primary) hypertension: Secondary | ICD-10-CM | POA: Diagnosis not present

## 2017-07-05 DIAGNOSIS — E785 Hyperlipidemia, unspecified: Secondary | ICD-10-CM | POA: Diagnosis not present

## 2017-07-05 DIAGNOSIS — M1991 Primary osteoarthritis, unspecified site: Secondary | ICD-10-CM | POA: Diagnosis not present

## 2017-07-05 DIAGNOSIS — I509 Heart failure, unspecified: Secondary | ICD-10-CM | POA: Diagnosis not present

## 2017-07-05 DIAGNOSIS — E118 Type 2 diabetes mellitus with unspecified complications: Secondary | ICD-10-CM | POA: Diagnosis not present

## 2017-07-05 DIAGNOSIS — M25531 Pain in right wrist: Secondary | ICD-10-CM | POA: Diagnosis not present

## 2017-07-05 DIAGNOSIS — W07XXXA Fall from chair, initial encounter: Secondary | ICD-10-CM

## 2017-07-05 DIAGNOSIS — S6991XA Unspecified injury of right wrist, hand and finger(s), initial encounter: Secondary | ICD-10-CM | POA: Diagnosis not present

## 2017-07-31 DIAGNOSIS — I10 Essential (primary) hypertension: Secondary | ICD-10-CM | POA: Diagnosis not present

## 2017-07-31 DIAGNOSIS — M170 Bilateral primary osteoarthritis of knee: Secondary | ICD-10-CM | POA: Diagnosis not present

## 2017-07-31 DIAGNOSIS — M6281 Muscle weakness (generalized): Secondary | ICD-10-CM | POA: Diagnosis not present

## 2017-07-31 DIAGNOSIS — R269 Unspecified abnormalities of gait and mobility: Secondary | ICD-10-CM | POA: Diagnosis not present

## 2017-08-02 DIAGNOSIS — I10 Essential (primary) hypertension: Secondary | ICD-10-CM | POA: Diagnosis not present

## 2017-08-02 DIAGNOSIS — M6281 Muscle weakness (generalized): Secondary | ICD-10-CM | POA: Diagnosis not present

## 2017-08-02 DIAGNOSIS — M170 Bilateral primary osteoarthritis of knee: Secondary | ICD-10-CM | POA: Diagnosis not present

## 2017-08-02 DIAGNOSIS — R269 Unspecified abnormalities of gait and mobility: Secondary | ICD-10-CM | POA: Diagnosis not present

## 2017-08-06 DIAGNOSIS — R269 Unspecified abnormalities of gait and mobility: Secondary | ICD-10-CM | POA: Diagnosis not present

## 2017-08-06 DIAGNOSIS — M6281 Muscle weakness (generalized): Secondary | ICD-10-CM | POA: Diagnosis not present

## 2017-08-06 DIAGNOSIS — I10 Essential (primary) hypertension: Secondary | ICD-10-CM | POA: Diagnosis not present

## 2017-08-06 DIAGNOSIS — M170 Bilateral primary osteoarthritis of knee: Secondary | ICD-10-CM | POA: Diagnosis not present

## 2017-08-09 DIAGNOSIS — M170 Bilateral primary osteoarthritis of knee: Secondary | ICD-10-CM | POA: Diagnosis not present

## 2017-08-09 DIAGNOSIS — I10 Essential (primary) hypertension: Secondary | ICD-10-CM | POA: Diagnosis not present

## 2017-08-09 DIAGNOSIS — M6281 Muscle weakness (generalized): Secondary | ICD-10-CM | POA: Diagnosis not present

## 2017-08-09 DIAGNOSIS — R269 Unspecified abnormalities of gait and mobility: Secondary | ICD-10-CM | POA: Diagnosis not present

## 2017-08-13 DIAGNOSIS — I10 Essential (primary) hypertension: Secondary | ICD-10-CM | POA: Diagnosis not present

## 2017-08-13 DIAGNOSIS — M6281 Muscle weakness (generalized): Secondary | ICD-10-CM | POA: Diagnosis not present

## 2017-08-13 DIAGNOSIS — M170 Bilateral primary osteoarthritis of knee: Secondary | ICD-10-CM | POA: Diagnosis not present

## 2017-08-13 DIAGNOSIS — R269 Unspecified abnormalities of gait and mobility: Secondary | ICD-10-CM | POA: Diagnosis not present

## 2017-08-14 DIAGNOSIS — I10 Essential (primary) hypertension: Secondary | ICD-10-CM | POA: Diagnosis not present

## 2017-08-14 DIAGNOSIS — M170 Bilateral primary osteoarthritis of knee: Secondary | ICD-10-CM | POA: Diagnosis not present

## 2017-08-14 DIAGNOSIS — M6281 Muscle weakness (generalized): Secondary | ICD-10-CM | POA: Diagnosis not present

## 2017-08-14 DIAGNOSIS — R269 Unspecified abnormalities of gait and mobility: Secondary | ICD-10-CM | POA: Diagnosis not present

## 2017-08-16 DIAGNOSIS — M6281 Muscle weakness (generalized): Secondary | ICD-10-CM | POA: Diagnosis not present

## 2017-08-16 DIAGNOSIS — I10 Essential (primary) hypertension: Secondary | ICD-10-CM | POA: Diagnosis not present

## 2017-08-16 DIAGNOSIS — R269 Unspecified abnormalities of gait and mobility: Secondary | ICD-10-CM | POA: Diagnosis not present

## 2017-08-16 DIAGNOSIS — M170 Bilateral primary osteoarthritis of knee: Secondary | ICD-10-CM | POA: Diagnosis not present

## 2017-08-20 DIAGNOSIS — M170 Bilateral primary osteoarthritis of knee: Secondary | ICD-10-CM | POA: Diagnosis not present

## 2017-08-20 DIAGNOSIS — R269 Unspecified abnormalities of gait and mobility: Secondary | ICD-10-CM | POA: Diagnosis not present

## 2017-08-20 DIAGNOSIS — M6281 Muscle weakness (generalized): Secondary | ICD-10-CM | POA: Diagnosis not present

## 2017-08-20 DIAGNOSIS — I10 Essential (primary) hypertension: Secondary | ICD-10-CM | POA: Diagnosis not present

## 2017-08-21 DIAGNOSIS — M170 Bilateral primary osteoarthritis of knee: Secondary | ICD-10-CM | POA: Diagnosis not present

## 2017-08-21 DIAGNOSIS — R269 Unspecified abnormalities of gait and mobility: Secondary | ICD-10-CM | POA: Diagnosis not present

## 2017-08-21 DIAGNOSIS — M6281 Muscle weakness (generalized): Secondary | ICD-10-CM | POA: Diagnosis not present

## 2017-08-21 DIAGNOSIS — I10 Essential (primary) hypertension: Secondary | ICD-10-CM | POA: Diagnosis not present

## 2017-08-23 DIAGNOSIS — R269 Unspecified abnormalities of gait and mobility: Secondary | ICD-10-CM | POA: Diagnosis not present

## 2017-08-23 DIAGNOSIS — M6281 Muscle weakness (generalized): Secondary | ICD-10-CM | POA: Diagnosis not present

## 2017-08-23 DIAGNOSIS — M170 Bilateral primary osteoarthritis of knee: Secondary | ICD-10-CM | POA: Diagnosis not present

## 2017-08-23 DIAGNOSIS — I10 Essential (primary) hypertension: Secondary | ICD-10-CM | POA: Diagnosis not present

## 2017-08-27 DIAGNOSIS — I10 Essential (primary) hypertension: Secondary | ICD-10-CM | POA: Diagnosis not present

## 2017-08-27 DIAGNOSIS — M170 Bilateral primary osteoarthritis of knee: Secondary | ICD-10-CM | POA: Diagnosis not present

## 2017-08-27 DIAGNOSIS — R269 Unspecified abnormalities of gait and mobility: Secondary | ICD-10-CM | POA: Diagnosis not present

## 2017-08-27 DIAGNOSIS — M6281 Muscle weakness (generalized): Secondary | ICD-10-CM | POA: Diagnosis not present

## 2017-08-28 DIAGNOSIS — M170 Bilateral primary osteoarthritis of knee: Secondary | ICD-10-CM | POA: Diagnosis not present

## 2017-08-28 DIAGNOSIS — R269 Unspecified abnormalities of gait and mobility: Secondary | ICD-10-CM | POA: Diagnosis not present

## 2017-08-28 DIAGNOSIS — I10 Essential (primary) hypertension: Secondary | ICD-10-CM | POA: Diagnosis not present

## 2017-08-28 DIAGNOSIS — M6281 Muscle weakness (generalized): Secondary | ICD-10-CM | POA: Diagnosis not present

## 2017-09-04 DIAGNOSIS — M170 Bilateral primary osteoarthritis of knee: Secondary | ICD-10-CM | POA: Diagnosis not present

## 2017-09-04 DIAGNOSIS — M6281 Muscle weakness (generalized): Secondary | ICD-10-CM | POA: Diagnosis not present

## 2017-09-04 DIAGNOSIS — R269 Unspecified abnormalities of gait and mobility: Secondary | ICD-10-CM | POA: Diagnosis not present

## 2017-09-04 DIAGNOSIS — I10 Essential (primary) hypertension: Secondary | ICD-10-CM | POA: Diagnosis not present

## 2017-09-06 DIAGNOSIS — M6281 Muscle weakness (generalized): Secondary | ICD-10-CM | POA: Diagnosis not present

## 2017-09-06 DIAGNOSIS — I10 Essential (primary) hypertension: Secondary | ICD-10-CM | POA: Diagnosis not present

## 2017-09-06 DIAGNOSIS — M170 Bilateral primary osteoarthritis of knee: Secondary | ICD-10-CM | POA: Diagnosis not present

## 2017-09-06 DIAGNOSIS — R269 Unspecified abnormalities of gait and mobility: Secondary | ICD-10-CM | POA: Diagnosis not present

## 2017-09-10 ENCOUNTER — Other Ambulatory Visit: Payer: Self-pay

## 2017-09-10 ENCOUNTER — Emergency Department (HOSPITAL_COMMUNITY): Payer: Medicare Other

## 2017-09-10 ENCOUNTER — Observation Stay (HOSPITAL_COMMUNITY)
Admission: EM | Admit: 2017-09-10 | Discharge: 2017-09-12 | Disposition: A | Discharge status: Home Health | Payer: Medicare Other | Attending: Internal Medicine | Admitting: Internal Medicine

## 2017-09-10 ENCOUNTER — Encounter (HOSPITAL_COMMUNITY): Payer: Self-pay

## 2017-09-10 DIAGNOSIS — M1990 Unspecified osteoarthritis, unspecified site: Secondary | ICD-10-CM | POA: Diagnosis not present

## 2017-09-10 DIAGNOSIS — I10 Essential (primary) hypertension: Secondary | ICD-10-CM | POA: Diagnosis not present

## 2017-09-10 DIAGNOSIS — I5032 Chronic diastolic (congestive) heart failure: Secondary | ICD-10-CM | POA: Insufficient documentation

## 2017-09-10 DIAGNOSIS — R2681 Unsteadiness on feet: Secondary | ICD-10-CM | POA: Diagnosis not present

## 2017-09-10 DIAGNOSIS — I509 Heart failure, unspecified: Secondary | ICD-10-CM

## 2017-09-10 DIAGNOSIS — W19XXXA Unspecified fall, initial encounter: Secondary | ICD-10-CM | POA: Diagnosis not present

## 2017-09-10 DIAGNOSIS — D638 Anemia in other chronic diseases classified elsewhere: Secondary | ICD-10-CM | POA: Insufficient documentation

## 2017-09-10 DIAGNOSIS — Z90710 Acquired absence of both cervix and uterus: Secondary | ICD-10-CM | POA: Diagnosis not present

## 2017-09-10 DIAGNOSIS — I272 Pulmonary hypertension, unspecified: Secondary | ICD-10-CM | POA: Insufficient documentation

## 2017-09-10 DIAGNOSIS — E876 Hypokalemia: Secondary | ICD-10-CM | POA: Insufficient documentation

## 2017-09-10 DIAGNOSIS — I110 Hypertensive heart disease with heart failure: Secondary | ICD-10-CM | POA: Insufficient documentation

## 2017-09-10 DIAGNOSIS — E785 Hyperlipidemia, unspecified: Secondary | ICD-10-CM | POA: Diagnosis not present

## 2017-09-10 DIAGNOSIS — R001 Bradycardia, unspecified: Secondary | ICD-10-CM | POA: Diagnosis not present

## 2017-09-10 DIAGNOSIS — Z7982 Long term (current) use of aspirin: Secondary | ICD-10-CM | POA: Diagnosis not present

## 2017-09-10 DIAGNOSIS — Z79899 Other long term (current) drug therapy: Secondary | ICD-10-CM | POA: Diagnosis not present

## 2017-09-10 DIAGNOSIS — R55 Syncope and collapse: Principal | ICD-10-CM

## 2017-09-10 LAB — I-STAT TROPONIN, ED: TROPONIN I, POC: 0 ng/mL (ref 0.00–0.08)

## 2017-09-10 LAB — BASIC METABOLIC PANEL
ANION GAP: 9 (ref 5–15)
BUN: 23 mg/dL (ref 8–23)
CALCIUM: 9 mg/dL (ref 8.9–10.3)
CHLORIDE: 102 mmol/L (ref 98–111)
CO2: 24 mmol/L (ref 22–32)
Creatinine, Ser: 1.22 mg/dL — ABNORMAL HIGH (ref 0.44–1.00)
GFR calc Af Amer: 44 mL/min — ABNORMAL LOW (ref >60)
GFR calc non Af Amer: 38 mL/min — ABNORMAL LOW (ref >60)
Glucose, Bld: 119 mg/dL — ABNORMAL HIGH (ref 70–99)
Potassium: 3.6 mmol/L (ref 3.5–5.1)
SODIUM: 135 mmol/L (ref 135–145)

## 2017-09-10 LAB — CBC
HCT: 34.1 % — ABNORMAL LOW (ref 36.0–46.0)
HEMOGLOBIN: 11.6 g/dL — AB (ref 12.0–15.0)
MCH: 31 pg (ref 26.0–34.0)
MCHC: 34 g/dL (ref 30.0–36.0)
MCV: 91.2 fL (ref 78.0–100.0)
Platelets: 307 10*3/uL (ref 150–400)
RBC: 3.74 MIL/uL — AB (ref 3.87–5.11)
RDW: 13.7 % (ref 11.5–15.5)
WBC: 8.6 10*3/uL (ref 4.0–10.5)

## 2017-09-10 LAB — CBG MONITORING, ED: Glucose-Capillary: 103 mg/dL — ABNORMAL HIGH (ref 70–99)

## 2017-09-10 LAB — TSH: TSH: 7.66 u[IU]/mL — ABNORMAL HIGH (ref 0.350–4.500)

## 2017-09-10 LAB — TROPONIN I: Troponin I: <0.03 ng/mL (ref <0.03)

## 2017-09-10 LAB — MAGNESIUM: Magnesium: 1.9 mg/dL (ref 1.7–2.4)

## 2017-09-10 MED ORDER — MAGNESIUM CITRATE PO SOLN: 1.0000 | Freq: Once | ORAL | Status: DC | PRN

## 2017-09-10 MED ORDER — ONDANSETRON HCL 4 MG/2ML IJ SOLN: 4.0000 mg | Freq: Four times a day (QID) | INTRAMUSCULAR | Status: DC | PRN

## 2017-09-10 MED ORDER — SODIUM CHLORIDE 0.9% FLUSH
3.0000 mL | Freq: Two times a day (BID) | INTRAVENOUS | Status: DC
  Administered 2017-09-11 – 2017-09-12 (×2): 3 mL via INTRAVENOUS

## 2017-09-10 MED ORDER — SODIUM CHLORIDE 0.9 % IV BOLUS
1000.0000 mL | Freq: Once | INTRAVENOUS | Status: AC
Start: 2017-09-10 — End: 2017-09-10
  Administered 2017-09-10: 1000 mL via INTRAVENOUS

## 2017-09-10 MED ORDER — TRAMADOL HCL 50 MG PO TABS
50.0000 mg | ORAL_TABLET | Freq: Two times a day (BID) | ORAL | Status: DC | PRN
  Administered 2017-09-11: 50 mg via ORAL
  Filled 2017-09-10: qty 1

## 2017-09-10 MED ORDER — ACETAMINOPHEN 650 MG RE SUPP: 650.0000 mg | Freq: Four times a day (QID) | RECTAL | Status: DC | PRN

## 2017-09-10 MED ORDER — SORBITOL 70 % SOLN
30.0000 mL | Freq: Every day | Status: DC | PRN
  Filled 2017-09-10: qty 30

## 2017-09-10 MED ORDER — ENOXAPARIN SODIUM 40 MG/0.4ML ~~LOC~~ SOLN
40.0000 mg | SUBCUTANEOUS | Status: DC
  Administered 2017-09-10: 40 mg via SUBCUTANEOUS
  Filled 2017-09-10: qty 0.4

## 2017-09-10 MED ORDER — ACETAMINOPHEN 325 MG PO TABS
650.0000 mg | ORAL_TABLET | Freq: Four times a day (QID) | ORAL | Status: DC | PRN
  Administered 2017-09-11: 650 mg via ORAL
  Filled 2017-09-10: qty 2

## 2017-09-10 MED ORDER — PREDNISONE 5 MG PO TABS
2.5000 mg | ORAL_TABLET | Freq: Every day | ORAL | Status: DC
  Administered 2017-09-11: 2.5 mg via ORAL
  Filled 2017-09-10: qty 1

## 2017-09-10 MED ORDER — ASPIRIN EC 81 MG PO TBEC
81.0000 mg | DELAYED_RELEASE_TABLET | Freq: Every day | ORAL | Status: DC
  Administered 2017-09-10 – 2017-09-12 (×3): 81 mg via ORAL
  Filled 2017-09-10 (×3): qty 1

## 2017-09-10 MED ORDER — ALUM & MAG HYDROXIDE-SIMETH 200-200-20 MG/5ML PO SUSP: 30.0000 mL | Freq: Four times a day (QID) | ORAL | Status: DC | PRN

## 2017-09-10 MED ORDER — ONDANSETRON HCL 4 MG PO TABS: 4.0000 mg | ORAL_TABLET | Freq: Four times a day (QID) | ORAL | Status: DC | PRN

## 2017-09-10 MED ORDER — POLYETHYLENE GLYCOL 3350 17 G PO PACK: 17.0000 g | PACK | Freq: Every day | ORAL | Status: DC | PRN

## 2017-09-10 MED ORDER — SODIUM CHLORIDE 0.9 % IV SOLN
INTRAVENOUS | Status: DC
  Administered 2017-09-11 – 2017-09-12 (×2): via INTRAVENOUS

## 2017-09-10 NOTE — ED Notes (Signed)
Bed: [id number] Expected date:  Expected time:  Means of arrival:  Comments: Res a

## 2017-09-10 NOTE — ED Notes (Signed)
Pt placed on bedpan

## 2017-09-10 NOTE — ED Notes (Signed)
ED TO INPATIENT HANDOFF REPORT  Name/Age/Gender Yvette Lozano 84 y.o. female  Code Status Code Status History    Date Active Date Inactive Code Status Order ID Comments User Context   12/23/2015 2137 12/25/2015 1342 Full Code [id number]  Phillips Grout, MD Inpatient      Home/SNF/Other Home  Chief Complaint near syncope   Level of Care/Admitting Diagnosis ED Disposition    ED Disposition Condition [place]: [facility] [100102]  Level of Care: Telemetry [5]  Admit to tele based on following criteria: Eval of Syncope  Diagnosis: Syncope [206001]  Admitting Physician: Eugenie Filler [[id number]]  Attending Physician: Eugenie Filler [[id number]]  PT Class (Do Not Modify): Observation [104]  PT Acc Code (Do Not Modify): Observation [10022]       Medical History Past Medical History:  Diagnosis Date  . Arthritis   . CHF (congestive heart failure) ([place])   . Hypertension     Allergies No Known Allergies  IV Location/Drains/Wounds Patient Lines/Drains/Airways Status   Active Line/Drains/Airways    Name:   Placement date:   Placement time:   Site:   Days:   Peripheral IV 09/10/17 Right Wrist   09/10/17    1420    Wrist   less than 1          Labs/Imaging Results for orders placed or performed during the hospital encounter of 09/10/17 (from the past 48 hour(s))  CBG monitoring, ED     Status: Abnormal   Collection Time: 09/10/17  1:23 PM  Result Value Ref Range   Glucose-Capillary 103 (H) 70 - 99 mg/dL  Basic metabolic panel     Status: Abnormal   Collection Time: 09/10/17  2:03 PM  Result Value Ref Range   Sodium 135 135 - 145 mmol/L   Potassium 3.6 3.5 - 5.1 mmol/L   Chloride 102 98 - 111 mmol/L    Comment: Please note change in reference range.   CO2 24 22 - 32 mmol/L   Glucose, Bld 119 (H) 70 - 99 mg/dL    Comment: Please note change in reference range.   BUN 23 8 - 23 mg/dL    Comment: Please note change in  reference range.   Creatinine, Ser 1.22 (H) 0.44 - 1.00 mg/dL   Calcium 9.0 8.9 - 10.3 mg/dL   GFR calc non Af Amer 38 (L) >60 mL/min   GFR calc Af Amer 44 (L) >60 mL/min    Comment: (NOTE) The eGFR has been calculated using the CKD EPI equation. This calculation has not been validated in all clinical situations. eGFR's persistently <60 mL/min signify possible Chronic Kidney Disease.    Anion gap 9 5 - 15    Comment: Performed at [facility], [place] [address], [place], [place] [address]  CBC     Status: Abnormal   Collection Time: 09/10/17  2:03 PM  Result Value Ref Range   WBC 8.6 4.0 - 10.5 K/uL   RBC 3.74 (L) 3.87 - 5.11 MIL/uL   Hemoglobin 11.6 (L) 12.0 - 15.0 g/dL   HCT 34.1 (L) 36.0 - 46.0 %   MCV 91.2 78.0 - 100.0 fL   MCH 31.0 26.0 - 34.0 pg   MCHC 34.0 30.0 - 36.0 g/dL   RDW 13.7 11.5 - 15.5 %   Platelets 307 150 - 400 K/uL    Comment: Performed at [facility], [address] [place], [place]  [address]  I-Stat Troponin, ED (not at [facility])     Status: None   Collection Time: 09/10/17  3:31 PM  Result Value Ref Range   Troponin i, poc 0.00 0.00 - 0.08 ng/mL   Comment 3            Comment: Due to the release kinetics of cTnI, a negative result within the first hours of the onset of symptoms does not rule out myocardial infarction with certainty. If myocardial infarction is still suspected, repeat the test at appropriate intervals.    Dg Chest 2 View  Result Date: 09/10/2017 CLINICAL DATA:  Per EMS, pt from home here for syncopal episode. Pt alert and oriented x 4. Family states the pt seemed to pass out while in her chair, family members then helped the pt down to the floor. Pt denies injury or pain. EXAM: CHEST - 2 VIEW COMPARISON:  10/26/7 FINDINGS: Cardiac silhouette is normal in size. No mediastinal or hilar masses. No evidence of adenopathy. Prominent bronchovascular markings in the lung bases. Lungs otherwise clear. No pleural  effusion or pneumothorax. Skeletal structures are demineralized but grossly intact. IMPRESSION: No active cardiopulmonary disease. Electronically Signed   By: Lajean Manes M.D.   On: 09/10/2017 16:51   Ct Head Wo Contrast  Result Date: 09/10/2017 CLINICAL DATA:  84 year old female with syncope. EXAM: CT HEAD WITHOUT CONTRAST TECHNIQUE: Contiguous axial images were obtained from the base of the skull through the vertex without intravenous contrast. COMPARISON:  Head CT without contrast 12/23/2015 and brain MRI, intracranial MRA 12/24/2015. FINDINGS: Brain: Stable and normal for age cerebral volume. Chronic dystrophic basal ganglia calcifications incidentally noted. No midline shift, ventriculomegaly, mass effect, evidence of mass lesion, intracranial hemorrhage or evidence of cortically based acute infarction. Gray-white matter differentiation is stable and normal for age throughout the brain. No cortical encephalomalacia identified. Vascular: Mild Calcified atherosclerosis at the skull base. No suspicious intracranial vascular hyperdensity. Skull: Stable, negative. Sinuses/Orbits: Visualized paranasal sinuses and mastoids are stable and well pneumatized. Other: Stable and negative visible orbit and scalp soft tissues. IMPRESSION: Stable since 2017 and negative for age non contrast CT appearance of the brain. Electronically Signed   By: Genevie Ann M.D.   On: 09/10/2017 17:46    Pending Labs Unresulted Labs (From admission, onward)   Start     Ordered   09/10/17 1725  Culture, Urine  Once,   R     09/10/17 1724   09/10/17 1725  Magnesium  Once,   R     09/10/17 1724   09/10/17 1725  Troponin I (q 6hr x 3)  Now then every 6 hours,   R     09/10/17 1724   09/10/17 1725  TSH  Once,   R     09/10/17 1724   09/10/17 1338  Urinalysis, Routine w reflex microscopic  Once,   STAT     09/10/17 1338   Signed and Held  Basic metabolic panel  Tomorrow morning,   R     Signed and Held   Signed and Held  CBC   Tomorrow morning,   R     Signed and Held      Vitals/Pain Today's Vitals   09/10/17 1530 09/10/17 1554 09/10/17 1630 09/10/17 1754  BP: 104/66 104/66 113/60 (!) 147/133  Pulse:  75  73  Resp: '16 15 15 18  ' Temp:      TempSrc:      SpO2: 99% 98% 97% 95%  Weight:      Height:      PainSc:        Isolation Precautions No active isolations  Medications Medications  sodium chloride 0.9 % bolus 1,000 mL (1,000 mLs Intravenous New Bag/Given 09/10/17 1844)  0.9 %  sodium chloride infusion (has no administration in time range)    Mobility walks with device

## 2017-09-10 NOTE — ED Provider Notes (Signed)
[facility]-EMERGENCY DEPT Provider Note   CSN: [id number] Arrival date & time: 09/10/17  1313     History   Chief Complaint Chief Complaint  Patient presents with  . Loss of Consciousness    HPI Yvette Lozano is a 84 y.o. female.  HPI 84 year old female with past medical history as below including CHF, hypertension, here with loss of consciousness.  The patient was reportedly in her usual state of health.  She was sitting down at the table.  She then slumped over.  She was completely unresponsive but breathing.  The family called 911 and patient was lowered to the ground.  She regained consciousness after about 5 minutes.  There is no generalized shaking activity, no tongue biting, no loss of bowel or bladder function.  She merely return to her baseline mental status and she is able to remember EMS arriving in the right hand.  She denies any preceding chest pain, shortness of breath, palpitations, or other complaints.  She denies any recent medication changes.  She denies any recent weakness, illness, or other complaints.  She has taken her meds as prescribed.  Of note, she previously had one episode several years ago, but has not had any formal imaging of her heart since then.  No focal numbness or weakness.   Past Medical History:  Diagnosis Date  . Arthritis   . CHF (congestive heart failure) ([facility])   . Hypertension     Patient Active Problem List   Diagnosis Date Noted  . TIA (transient ischemic attack) 12/24/2015  . Hyperlipidemia   . Syncope and collapse 12/23/2015  . Congestive heart failure ([facility])   . Essential hypertension     Past Surgical History:  Procedure Laterality Date  . ABDOMINAL HYSTERECTOMY       OB History   None      Home Medications    Prior to Admission medications   Medication Sig Start Date End Date Taking? Authorizing Provider  amLODipine-benazepril (LOTREL) 10-20 MG capsule Take 1 capsule by mouth daily. HOLD until  follow-up with your doctor 12/25/15  Yes Rai, Ripudeep K, MD  aspirin EC 81 MG tablet Take 1 tablet (81 mg total) by mouth daily. 12/25/15  Yes Rai, Ripudeep K, MD  carvedilol (COREG) 25 MG tablet Take 25 mg by mouth 2 (two) times daily. 11/24/15  Yes [provider]  hydrochlorothiazide (HYDRODIURIL) 25 MG tablet Take 25 mg by mouth daily. 08/22/17  Yes [provider]  predniSONE (DELTASONE) 2.5 MG tablet Take 2.5 mg by mouth daily. 11/26/15  Yes [provider]    Family History No family history on file.  Social History Social History   Tobacco Use  . Smoking status: Never Smoker  . Smokeless tobacco: Never Used  Substance Use Topics  . Alcohol use: No  . Drug use: No     Allergies   Patient has no known allergies.   Review of Systems Review of Systems  Constitutional: Positive for fatigue. Negative for chills and fever.  HENT: Negative for congestion and rhinorrhea.   Eyes: Negative for visual disturbance.  Respiratory: Negative for cough, shortness of breath and wheezing.   Cardiovascular: Negative for chest pain and leg swelling.  Gastrointestinal: Negative for abdominal pain, diarrhea, nausea and vomiting.  Genitourinary: Negative for dysuria and flank pain.  Musculoskeletal: Negative for neck pain and neck stiffness.  Skin: Negative for rash and wound.  Allergic/Immunologic: Negative for immunocompromised state.  Neurological: Positive for syncope and  weakness. Negative for headaches.  All other systems reviewed and are negative.    Physical Exam Updated Vital Signs BP 104/66 (BP Location: Left Arm)   Pulse 75   Temp 97.8 F (36.6 C) (Oral)   Resp 15   Ht 5' (1.524 m)   Wt 69.9 kg (154 lb)   SpO2 98%   BMI 30.08 kg/m   Physical Exam  Constitutional: She is oriented to person, place, and time. She appears well-developed and well-nourished. No distress.  HENT:  Head: Normocephalic and atraumatic.  Eyes: Conjunctivae are  normal.  Neck: Neck supple.  Cardiovascular: Normal rate, regular rhythm and normal heart sounds. Exam reveals no friction rub.  No murmur heard. Pulmonary/Chest: Effort normal and breath sounds normal. No respiratory distress. She has no wheezes. She has no rales.  Abdominal: She exhibits no distension.  Musculoskeletal: She exhibits no edema.  Neurological: She is alert and oriented to person, place, and time. She exhibits normal muscle tone.  Skin: Skin is warm. Capillary refill takes less than 2 seconds.  Psychiatric: She has a normal mood and affect.  Nursing note and vitals reviewed.    ED Treatments / Results  Labs (all labs ordered are listed, but only abnormal results are displayed) Labs Reviewed  BASIC METABOLIC PANEL - Abnormal; Notable for the following components:      Result Value   Glucose, Bld 119 (*)    Creatinine, Ser 1.22 (*)    GFR calc non Af Amer 38 (*)    GFR calc Af Amer 44 (*)    All other components within normal limits  CBC - Abnormal; Notable for the following components:   RBC 3.74 (*)    Hemoglobin 11.6 (*)    HCT 34.1 (*)    All other components within normal limits  CBG MONITORING, ED - Abnormal; Notable for the following components:   Glucose-Capillary 103 (*)    All other components within normal limits  URINALYSIS, ROUTINE W REFLEX MICROSCOPIC  CBG MONITORING, ED  I-STAT TROPONIN, ED    EKG EKG Interpretation  Date/Time:  Monday September 10 2017 13:21:39 EDT Ventricular Rate:  70 PR Interval:    QRS Duration: 92 QT Interval:  407 QTC Calculation: 440 R Axis:   26 Text Interpretation:  Sinus rhythm Abnormal R-wave progression, early transition Minimal ST elevation, inferior leads Baseline artifact Otherwise no significant change Confirmed by Shaune Pollack [phone]) on 09/10/2017 3:33:42 PM   Radiology Dg Chest 2 View  Result Date: 09/10/2017 CLINICAL DATA:  Per EMS, pt from home here for syncopal episode. Pt alert and oriented x 4.  Family states the pt seemed to pass out while in her chair, family members then helped the pt down to the floor. Pt denies injury or pain. EXAM: CHEST - 2 VIEW COMPARISON:  10/26/7 FINDINGS: Cardiac silhouette is normal in size. No mediastinal or hilar masses. No evidence of adenopathy. Prominent bronchovascular markings in the lung bases. Lungs otherwise clear. No pleural effusion or pneumothorax. Skeletal structures are demineralized but grossly intact. IMPRESSION: No active cardiopulmonary disease. Electronically Signed   By: Amie Portland M.D.   On: 09/10/2017 16:51    Procedures Procedures (including critical care time)  Medications Ordered in ED Medications - No data to display   Initial Impression / Assessment and Plan / ED Course  I have reviewed the triage vital signs and the nursing notes.  Pertinent labs & imaging results that were available during my care of the patient were  reviewed by me and considered in my medical decision making (see chart for details).   84 yo F with PMHx as above here with unprovoked syncope.  EKG without ectopy.  Telemetry with PVCs but otherwise unremarkable.  Labs reassuring.  She has baseline mild anemia.  Troponin negative.  No headache, neuro deficits, or signs of stroke.  There is no seizure-like activity.  She does have a remote history of CHF that has not been evaluated since 2017.  Given her age, CHF, absence of prodrome, will admit for syncope observation.  Final Clinical Impressions(s) / ED Diagnoses   Final diagnoses:  Syncope, unspecified syncope type    ED Discharge Orders    None       Shaune Pollack, MD 09/10/17 1704

## 2017-09-10 NOTE — ED Notes (Signed)
Patient transported to X-ray 

## 2017-09-10 NOTE — ED Notes (Signed)
Water given  

## 2017-09-10 NOTE — ED Triage Notes (Signed)
Per EMS, pt from home here for syncopal episode. Pt alert and oriented x 4. Family states the pt seemed to pass out while in her chair, family members then helped the pt down to the floor. Pt denies injury or pain.   BP 110/62 HR 72 RR 18 O2 98 CBG 154

## 2017-09-10 NOTE — H&P (Signed)
History and Physical    Yvette Lozano WUJ:[id number] DOB: September 07, 1925 DOA: 09/10/2017  PCP: Renaye Rakers, MD  Patient coming from: Home  I have personally briefly reviewed patient's old medical records in [facility]  Chief Complaint: Syncope  HPI: Yvette Lozano is a 84 y.o. female with medical history significant of CHF, hypertension, hyperlipidemia, prior history of syncope per patient, on diuretics presented to the ED with a syncopal episode.  Patient at bedside and states came from the bathroom setting her chair and subsequently passed out.  States she is not sure what happened at all she knows is that the paramedics got there.  No family at bedside and as such history obtained from EDP's note.  Per EDP patient was sitting down at the table and subsequently slumped over was completely unresponsive but breathing.  911 was called and patient lowered to the ground.  Patient regained consciousness about 5 minutes later.  No generalized shaking activity noted.  No tongue biting.  No loss of bowel or bladder function.  Patient with nearly returned to her baseline mental status.  Patient denies any lightheadedness or dizziness.  No preceding chest pain or shortness of breath.  No palpitations.  Patient denies any fevers, no chills, no nausea, no vomiting, no abdominal pain, no diarrhea, no constipation, no melena, no hematemesis, no hematochezia.  No visual changes.  No asymmetric weakness or numbness.  No slurred speech.  Patient does complain of feeling cold all the time.  ED Course: Patient seen in the ED chest x-ray which was obtained was unremarkable.  Head CT which was obtained was negative for any acute abnormalities.  Basic metabolic profile obtained with a glucose of 119, creatinine of 1.22 otherwise is within normal limits.  Point-of-care troponin negative.  CBC with a white count of 11.6 otherwise was within normal limits.  EKG with a normal sinus rhythm.  Patient noted on monitor in the  ED to have systolic blood pressures in the high 80s and mostly in the 90s.  Patient given a liter bolus of normal saline.  Hospitalist were called to admit the patient.  Review of Systems: As per HPI otherwise 10 point review of systems negative.   Past Medical History:  Diagnosis Date  . Arthritis   . CHF (congestive heart failure) (HCC)   . Hypertension     Past Surgical History:  Procedure Laterality Date  . ABDOMINAL HYSTERECTOMY       reports that she has never smoked. She has never used smokeless tobacco. She reports that she does not drink alcohol or use drugs.  No Known Allergies  No family history on file. Mother deceased in the 61s cause unknown per patient.  Father deceased cause unknown per patient.  Prior to Admission medications   Medication Sig Start Date End Date Taking? Authorizing Provider  amLODipine-benazepril (LOTREL) 10-20 MG capsule Take 1 capsule by mouth daily. HOLD until follow-up with your doctor 12/25/15  Yes Rai, Ripudeep K, MD  aspirin EC 81 MG tablet Take 1 tablet (81 mg total) by mouth daily. 12/25/15  Yes Rai, Ripudeep K, MD  carvedilol (COREG) 25 MG tablet Take 25 mg by mouth 2 (two) times daily. 11/24/15  Yes [provider]  hydrochlorothiazide (HYDRODIURIL) 25 MG tablet Take 25 mg by mouth daily. 08/22/17  Yes [provider]  predniSONE (DELTASONE) 2.5 MG tablet Take 2.5 mg by mouth daily. 11/26/15  Yes [provider]    Physical Exam: Vitals:  09/10/17 1530 09/10/17 1554 09/10/17 1630 09/10/17 1754  BP: 104/66 104/66 113/60 (!) 147/133  Pulse:  75  73  Resp: 16 15 15 18   Temp:      TempSrc:      SpO2: 99% 98% 97% 95%  Weight:      Height:        Constitutional: NAD, calm, comfortable Vitals:   09/10/17 1530 09/10/17 1554 09/10/17 1630 09/10/17 1754  BP: 104/66 104/66 113/60 (!) 147/133  Pulse:  75  73  Resp: 16 15 15 18   Temp:      TempSrc:      SpO2: 99% 98% 97% 95%  Weight:      Height:        Eyes: PERRL, lids and conjunctivae normal ENMT: Mucous membranes are moist. Posterior pharynx clear of any exudate or lesions.  Dentures noted.  Neck: normal, supple, no masses, no thyromegaly Respiratory: clear to auscultation bilaterally, no wheezing, no crackles. Normal respiratory effort. No accessory muscle use.  Cardiovascular: Regular rate and rhythm, no murmurs / rubs / gallops. No extremity edema. 2+ pedal pulses. No carotid bruits.  Abdomen: no tenderness, no masses palpated. No hepatosplenomegaly. Bowel sounds positive.  Musculoskeletal: no clubbing / cyanosis. No joint deformity upper and lower extremities. Good ROM, no contractures. Normal muscle tone.  Skin: no rashes, lesions, ulcers. No induration Neurologic: CN 2-12 grossly intact. Sensation intact, unable to assess reflexes symmetrically and diffusely.  Strength 5/5 in all 4.  Psychiatric: Normal judgment and insight. Alert and oriented x 3. Normal mood.     Labs on Admission: I have personally reviewed following labs and imaging studies  CBC: Recent Labs  Lab 09/10/17 1403  WBC 8.6  HGB 11.6*  HCT 34.1*  MCV 91.2  PLT 307   Basic Metabolic Panel: Recent Labs  Lab 09/10/17 1403  NA 135  K 3.6  CL 102  CO2 24  GLUCOSE 119*  BUN 23  CREATININE 1.22*  CALCIUM 9.0   GFR: Estimated Creatinine Clearance: 26.8 mL/min (A) (by C-G formula based on SCr of 1.22 mg/dL (H)). Liver Function Tests: No results for input(s): AST, ALT, ALKPHOS, BILITOT, PROT, ALBUMIN in the last 168 hours. No results for input(s): LIPASE, AMYLASE in the last 168 hours. No results for input(s): AMMONIA in the last 168 hours. Coagulation Profile: No results for input(s): INR, PROTIME in the last 168 hours. Cardiac Enzymes: No results for input(s): CKTOTAL, CKMB, CKMBINDEX, TROPONINI in the last 168 hours. BNP (last 3 results) No results for input(s): PROBNP in the last 8760 hours. HbA1C: No results for input(s): HGBA1C in the last  72 hours. CBG: Recent Labs  Lab 09/10/17 1323  GLUCAP 103*   Lipid Profile: No results for input(s): CHOL, HDL, LDLCALC, TRIG, CHOLHDL, LDLDIRECT in the last 72 hours. Thyroid Function Tests: No results for input(s): TSH, T4TOTAL, FREET4, T3FREE, THYROIDAB in the last 72 hours. Anemia Panel: No results for input(s): VITAMINB12, FOLATE, FERRITIN, TIBC, IRON, RETICCTPCT in the last 72 hours. Urine analysis:    Component Value Date/Time   COLORURINE YELLOW 12/23/2015 1732   APPEARANCEUR CLOUDY (A) 12/23/2015 1732   LABSPEC 1.015 12/23/2015 1732   PHURINE 7.5 12/23/2015 1732   GLUCOSEU NEGATIVE 12/23/2015 1732   HGBUR NEGATIVE 12/23/2015 1732   BILIRUBINUR NEGATIVE 12/23/2015 1732   KETONESUR NEGATIVE 12/23/2015 1732   PROTEINUR NEGATIVE 12/23/2015 1732   NITRITE NEGATIVE 12/23/2015 1732   LEUKOCYTESUR NEGATIVE 12/23/2015 1732    Radiological Exams on Admission: Dg Chest  2 View  Result Date: 09/10/2017 CLINICAL DATA:  Per EMS, pt from home here for syncopal episode. Pt alert and oriented x 4. Family states the pt seemed to pass out while in her chair, family members then helped the pt down to the floor. Pt denies injury or pain. EXAM: CHEST - 2 VIEW COMPARISON:  10/26/7 FINDINGS: Cardiac silhouette is normal in size. No mediastinal or hilar masses. No evidence of adenopathy. Prominent bronchovascular markings in the lung bases. Lungs otherwise clear. No pleural effusion or pneumothorax. Skeletal structures are demineralized but grossly intact. IMPRESSION: No active cardiopulmonary disease. Electronically Signed   By: Amie Portlandavid  Ormond M.D.   On: 09/10/2017 16:51   Ct Head Wo Contrast  Result Date: 09/10/2017 CLINICAL DATA:  84 year old female with syncope. EXAM: CT HEAD WITHOUT CONTRAST TECHNIQUE: Contiguous axial images were obtained from the base of the skull through the vertex without intravenous contrast. COMPARISON:  Head CT without contrast 12/23/2015 and brain MRI, intracranial MRA  12/24/2015. FINDINGS: Brain: Stable and normal for age cerebral volume. Chronic dystrophic basal ganglia calcifications incidentally noted. No midline shift, ventriculomegaly, mass effect, evidence of mass lesion, intracranial hemorrhage or evidence of cortically based acute infarction. Gray-white matter differentiation is stable and normal for age throughout the brain. No cortical encephalomalacia identified. Vascular: Mild Calcified atherosclerosis at the skull base. No suspicious intracranial vascular hyperdensity. Skull: Stable, negative. Sinuses/Orbits: Visualized paranasal sinuses and mastoids are stable and well pneumatized. Other: Stable and negative visible orbit and scalp soft tissues. IMPRESSION: Stable since 2017 and negative for age non contrast CT appearance of the brain. Electronically Signed   By: Odessa FlemingH  Hall M.D.   On: 09/10/2017 17:46    EKG: Independently reviewed.  Normal sinus rhythm, poor R wave progression.  Assessment/Plan Principal Problem:   Syncope Active Problems:   Congestive heart failure (HCC)   Essential hypertension   Hyperlipidemia   1 syncope Questionable etiology.  Patient noted to have come from the bathroom setting the chair per patient and slumped down and was unresponsive for approximately 5 minutes by ED physician.  Blood pressure in the ED systolic noted to be in the 90s and 1 blood pressure in the high 80s.  EKG strip from EMS with normal sinus rhythm with no ischemic changes noted, no arrhythmias noted on there.  Chest x-ray negative for any acute abnormalities.  CT head negative for any acute abnormalities.  Patient with no focal neurological deficits.  Will admit patient to telemetry.  Cycle cardiac enzymes every 6 hours x3.  Check a 2D echo.  Check a magnesium level.  Check a TSH.  Hold antihypertensive medications for now as patient's blood pressure is borderline.  Hydrated IV fluids.  Follow.  2.  Hypertension Blood pressure borderline.  Hold  antihypertensive medications for now.  Likely resume home dose Coreg in the morning.  3.  Hyperlipidemia Check a fasting lipid panel.  4.  History of CHF Patient euvolemic on examination.  Blood pressure is borderline.  Cycle cardiac enzymes every 6 hours x3.  Check a 2D echo.  Check a TSH.  Hold Lotrel, HCTZ, Coreg.  Continue home dose aspirin.  Follow.  DVT prophylaxis: Lovenox Code Status: Full Family Communication: Updated patient.  No family at bedside. Disposition Plan: To be determined. Consults called none Admission status: Place in observation.   Ramiro Harvestaniel Thompson MD [facility] Pager [phone]  If 7PM-7AM, please contact night-coverage [email or web address] Password [email or web address]  09/10/2017, 6:29 PM

## 2017-09-11 ENCOUNTER — Observation Stay (HOSPITAL_BASED_OUTPATIENT_CLINIC_OR_DEPARTMENT_OTHER): Payer: Medicare Other

## 2017-09-11 DIAGNOSIS — I110 Hypertensive heart disease with heart failure: Secondary | ICD-10-CM | POA: Diagnosis not present

## 2017-09-11 DIAGNOSIS — I5032 Chronic diastolic (congestive) heart failure: Secondary | ICD-10-CM | POA: Diagnosis not present

## 2017-09-11 DIAGNOSIS — E876 Hypokalemia: Secondary | ICD-10-CM | POA: Diagnosis not present

## 2017-09-11 DIAGNOSIS — E785 Hyperlipidemia, unspecified: Secondary | ICD-10-CM | POA: Diagnosis not present

## 2017-09-11 DIAGNOSIS — R55 Syncope and collapse: Secondary | ICD-10-CM

## 2017-09-11 DIAGNOSIS — I509 Heart failure, unspecified: Secondary | ICD-10-CM | POA: Diagnosis not present

## 2017-09-11 DIAGNOSIS — I10 Essential (primary) hypertension: Secondary | ICD-10-CM | POA: Diagnosis not present

## 2017-09-11 LAB — BASIC METABOLIC PANEL
ANION GAP: 7 (ref 5–15)
BUN: 23 mg/dL (ref 8–23)
CALCIUM: 8.3 mg/dL — AB (ref 8.9–10.3)
CO2: 22 mmol/L (ref 22–32)
Chloride: 107 mmol/L (ref 98–111)
Creatinine, Ser: 1.05 mg/dL — ABNORMAL HIGH (ref 0.44–1.00)
GFR calc Af Amer: 53 mL/min — ABNORMAL LOW (ref >60)
GFR, EST NON AFRICAN AMERICAN: 45 mL/min — AB (ref >60)
Glucose, Bld: 92 mg/dL (ref 70–99)
Potassium: 3.4 mmol/L — ABNORMAL LOW (ref 3.5–5.1)
Sodium: 136 mmol/L (ref 135–145)

## 2017-09-11 LAB — CBC
HCT: 27.9 % — ABNORMAL LOW (ref 36.0–46.0)
Hemoglobin: 9.5 g/dL — ABNORMAL LOW (ref 12.0–15.0)
MCH: 31.1 pg (ref 26.0–34.0)
MCHC: 34.1 g/dL (ref 30.0–36.0)
MCV: 91.5 fL (ref 78.0–100.0)
Platelets: 260 10*3/uL (ref 150–400)
RBC: 3.05 MIL/uL — AB (ref 3.87–5.11)
RDW: 13.7 % (ref 11.5–15.5)
WBC: 8.1 10*3/uL (ref 4.0–10.5)

## 2017-09-11 LAB — TROPONIN I: Troponin I: <0.03 ng/mL (ref <0.03)

## 2017-09-11 LAB — LIPID PANEL
CHOL/HDL RATIO: 2.8 ratio
CHOLESTEROL: 113 mg/dL (ref 0–200)
HDL: 40 mg/dL — ABNORMAL LOW (ref >40)
LDL Cholesterol: 62 mg/dL (ref 0–99)
Triglycerides: 54 mg/dL (ref <150)
VLDL: 11 mg/dL (ref 0–40)

## 2017-09-11 LAB — ECHOCARDIOGRAM COMPLETE
HEIGHTINCHES: 60 in
Weight: 2462.1 oz

## 2017-09-11 LAB — GLUCOSE, CAPILLARY: GLUCOSE-CAPILLARY: 91 mg/dL (ref 70–99)

## 2017-09-11 MED ORDER — POTASSIUM CHLORIDE CRYS ER 20 MEQ PO TBCR
40.0000 meq | EXTENDED_RELEASE_TABLET | Freq: Once | ORAL | Status: AC
  Administered 2017-09-11: 40 meq via ORAL
  Filled 2017-09-11: qty 2

## 2017-09-11 MED ORDER — PREDNISONE 5 MG PO TABS
5.0000 mg | ORAL_TABLET | Freq: Every day | ORAL | Status: DC
  Administered 2017-09-12: 5 mg via ORAL
  Filled 2017-09-11: qty 1

## 2017-09-11 MED ORDER — PREDNISONE 5 MG PO TABS
2.5000 mg | ORAL_TABLET | Freq: Once | ORAL | Status: AC
  Administered 2017-09-11: 2.5 mg via ORAL
  Filled 2017-09-11: qty 1

## 2017-09-11 MED ORDER — ENOXAPARIN SODIUM 30 MG/0.3ML ~~LOC~~ SOLN
30.0000 mg | SUBCUTANEOUS | Status: DC
  Administered 2017-09-11: 30 mg via SUBCUTANEOUS
  Filled 2017-09-11: qty 0.3

## 2017-09-11 NOTE — Care Management Note (Signed)
Case Management Note  Patient Details  Name: Lorenza Evangelistda M Tait MRN: [id number] Date of Birth: Nov 27, 1925  Subjective/Objective: Spoke to patient/dtr in rm about d/c plans-[facility]-they are aware of d/c back home w/HHC-HHC orders already placed. No further CM needs.                   Action/Plan:d/c home w/HHC.   Expected Discharge Date:  (unknown)               Expected Discharge Plan:  Home w Home Health Services  In-House Referral:     Discharge planning Services  CM Consult  Post Acute Care Choice:  Durable Medical Equipment(rw;Active w/[facility] HHRN/PT/aide) Choice offered to:  Patient  DME Arranged:    DME Agency:     HH Arranged:  RN, PT, OT, Nurse's Aide HH Agency:  [facility]  Status of Service:  In process, will continue to follow  If discussed at Long Length of Stay Meetings, dates discussed:    Additional Comments:  Lanier ClamMahabir, Tellis Spivak, RN 09/11/2017, 12:42 PM

## 2017-09-11 NOTE — Evaluation (Signed)
Physical Therapy Evaluation Patient Details Name: Yvette Lozano M Budzynski MRN: [id number] DOB: 03-Jul-1925 Today's Date: 09/11/2017   History of Present Illness   Yvette Lozano M Dimaggio is a 84 y.o. female with medical history significant of CHF, hypertension, hyperlipidemia, prior history of syncope per patient, on diuretics presented to the ED with a syncopal episode.   Clinical Impression  Patient presents with decreased mobility due to generalized weakness, fear of falling/syncopal symptoms, decreased balance and will benefit from skilled PT in the acute setting.  Noted orthostatics WNL, but low resting BP.   Orthostatic VS for the past 24 hrs (Last 3 readings):  BP- Lying Pulse- Lying BP- Sitting Pulse- Sitting BP- Standing at 0 minutes Pulse- Standing at 0 minutes BP- Standing at 3 minutes Pulse- Standing at 3 minutes  09/11/17 1017 (!) 85/71 77 105/59 81 103/62 86 (!) 112/92 90      Follow Up Recommendations Home health PT;Supervision/Assistance - 24 hour    Equipment Recommendations  None recommended by PT    Recommendations for Other Services       Precautions / Restrictions Precautions Precautions: Fall Restrictions Weight Bearing Restrictions: No      Mobility  Bed Mobility Overal bed mobility: Modified Independent                Transfers Overall transfer level: Needs assistance Equipment used: Rolling walker (2 wheeled) Transfers: Sit to/from Stand Sit to Stand: Min guard         General transfer comment: for balance/safety  Ambulation/Gait Ambulation/Gait assistance: Min guard;Min assist Gait Distance (Feet): 70 Feet Assistive device: Rolling walker (2 wheeled) Gait Pattern/deviations: Step-through pattern;Decreased stride length;Shuffle;Trunk flexed     General Gait Details: pushes walker too far forward, assist for safety, balance, turning  Stairs            Wheelchair Mobility    Modified Rankin (Stroke Patients Only)       Balance Overall  balance assessment: Needs assistance   Sitting balance-Leahy Scale: Good     Standing balance support: Bilateral upper extremity supported Standing balance-Leahy Scale: Poor Standing balance comment: UE support in static standing                              Pertinent Vitals/Pain Pain Assessment: Faces Faces Pain Scale: Hurts little more Pain Location: L lower leg Pain Descriptors / Indicators: Sore Pain Intervention(s): Monitored during session;Repositioned    Home Living Family/patient expects to be discharged to:: Private residence Living Arrangements: Children Available Help at Discharge: Family;Available 24 hours/day Type of Home: House Home Access: Stairs to enter Entrance Stairs-Rails: Right Entrance Stairs-Number of Steps: 2-3 Home Layout: One level Home Equipment: Walker - 2 wheels;Bedside commode;Shower seat;Grab bars - tub/shower      Prior Function Level of Independence: Needs assistance   Gait / Transfers Assistance Needed: has help when negotiating stairs  ADL's / Homemaking Assistance Needed: daughter helps with showering        Hand Dominance        Extremity/Trunk Assessment   Upper Extremity Assessment Upper Extremity Assessment: Overall WFL for tasks assessed    Lower Extremity Assessment Lower Extremity Assessment: Overall WFL for tasks assessed    Cervical / Trunk Assessment Cervical / Trunk Assessment: Kyphotic;Other exceptions Cervical / Trunk Exceptions: scoliotic R hip higher  Communication   Communication: No difficulties  Cognition Arousal/Alertness: Awake/alert Behavior During Therapy: WFL for tasks assessed/performed Overall Cognitive Status: Within Functional Limits  for tasks assessed                                        General Comments General comments (skin integrity, edema, etc.): daughter arrived end of session, patient reports ready to "quit walking" and seems fearful of syncopal  symptoms.  Encouraged working to resolve symptoms and needs to keep walking with assist.    Exercises     Assessment/Plan    PT Assessment Patient needs continued PT services  PT Problem List Decreased mobility;Decreased balance;Decreased activity tolerance;Decreased knowledge of use of DME;Cardiopulmonary status limiting activity       PT Treatment Interventions DME instruction;Therapeutic activities;Gait training;Therapeutic exercise;Patient/family education;Stair training;Balance training;Functional mobility training    PT Goals (Current goals can be found in the Care Plan section)  Acute Rehab PT Goals Patient Stated Goal: agreeable to work on walking PT Goal Formulation: With patient Time For Goal Achievement: 09/18/17 Potential to Achieve Goals: Good    Frequency Min 3X/week   Barriers to discharge        Co-evaluation               AM-PAC PT "6 Clicks" Daily Activity  Outcome Measure Difficulty turning over in bed (including adjusting bedclothes, sheets and blankets)?: A Little Difficulty moving from lying on back to sitting on the side of the bed? : A Little Difficulty sitting down on and standing up from a chair with arms (e.g., wheelchair, bedside commode, etc,.)?: Unable Help needed moving to and from a bed to chair (including a wheelchair)?: A Little Help needed walking in hospital room?: A Little Help needed climbing 3-5 steps with a railing? : A Lot 6 Click Score: 15    End of Session Equipment Utilized During Treatment: Gait belt Activity Tolerance: Patient tolerated treatment well Patient left: in bed;with call bell/phone within reach;with bed alarm set;with family/visitor present   PT Visit Diagnosis: Other abnormalities of gait and mobility (R26.89)    Time: 1010-1046 PT Time Calculation (min) (ACUTE ONLY): 36 min   Charges:   PT Evaluation $PT Eval Moderate Complexity: 1 Mod PT Treatments $Gait Training: 8-22 mins   PT G CodesSheran Lawless, [place] [phone] 09/11/2017   Elray Mcgregor 09/11/2017, 11:06 AM

## 2017-09-11 NOTE — Progress Notes (Addendum)
PROGRESS NOTE    Yvette Lozano  WJX:[id number] DOB: [birth year] DOA: 09/10/2017 PCP: Renaye RakersBland, Veita, MD    Brief Narrative:  Patient is a 84 year old female medical history of CHF, hypertension, hyperlipidemia prior history of syncope per patient, on diuretics, beta-blocker, calcium channel blocker and ACE inhibitor.  Presented to the ED with a syncopal episode.  Patient was noted to be seated and subsequently passed out/syncopized which lasted approximately 5 minutes.  EMS was called and patient transported to the ED.  Patient was not noted to have any post ictal state no confusion.  Patient denied any prodrome.  Patient denied any dizziness or lightheadedness.   Assessment & Plan:   Principal Problem:   Syncope Active Problems:   Congestive heart failure (HCC)   Essential hypertension   Hyperlipidemia  #1 syncope Concern for cardiac etiology.  Patient noted to be seated when she syncopized.  Patient denied any prodrome, no lightheadedness or dizziness.  In the ED patient noted to have systolic blood pressures in the 90s.  Patient also noted to be on a calcium channel blocker, beta-blocker, ACE inhibitor and diuretics.  Patient's ACE inhibitor, calcium channel blocker, beta-blocker and diuretics have been discontinued.  Patient with no syncopal episodes.  Cardiac enzymes negative x3.  TSH at 7.660.  Check a free T4.  2D echo pending.  Repeat EKG.  Continue gentle hydration with IV fluids.  Patient noted not to be orthostatic.  Placed on TED hose.  Consult with cardiology for further evaluation and management.  May need a Holter monitor.  2.  CHF Stable.  Patient currently euvolemic.  Follow.  3.  Hyperlipidemia Fasting lipid panel with a LDL of 62.  4.  Hypertension Blood pressure was borderline supine in the ED.  Patient had presented with a syncopal episode.  Systolic blood pressures on presentation went in the 90s.  Systolic blood pressure today in the low 100s.  Continue to hold ACE  inhibitor, beta-blocker, calcium channel blocker, diuretics.  Follow.  5.  Hypokalemia Magnesium level was 1.9.  Replete.  6.  Anemia Likely dilutional component.  Patient with no overt bleeding.  Check an anemia panel.  Follow H&H.   DVT prophylaxis: Lovenox Code Status: Full Family Communication: Updated patient and daughter at bedside. Disposition Plan: Home once medically stable, no further syncopal episodes, evaluation per cardiology, improvement with supine blood pressure.   Consultants:   Cardiology: Dr. Jacinto HalimGanji pending  Procedures  CT head 09/10/2017  Chest x-ray 09/10/2017  2D echo pending 09/11/2017  Antimicrobials:  None   Subjective: Patient in bed.  Denies any chest pain or shortness of breath.  Denies any further syncopal episodes.  Daughter at bedside.  Objective: Vitals:   09/10/17 1754 09/10/17 2015 09/10/17 2105 09/11/17 0517  BP: (!) 147/133 (!) 102/58 106/63 111/67  Pulse: 73 76 69 74  Resp: 18 17 16 18   Temp:   98.2 F (36.8 C) 98.3 F (36.8 C)  TempSrc:   Oral Oral  SpO2: 95% 97% 100% 100%  Weight:    69.8 kg (153 lb 14.1 oz)  Height:        Intake/Output Summary (Last 24 hours) at 09/11/2017 1131 Last data filed at 09/11/2017 1011 Gross per 24 hour  Intake 2405.39 ml  Output -  Net 2405.39 ml   Filed Weights   09/10/17 1419 09/11/17 0517  Weight: 69.9 kg (154 lb) 69.8 kg (153 lb 14.1 oz)    Examination:  General exam: Appears calm and comfortable  Respiratory system: Clear to auscultation. Respiratory effort normal. Cardiovascular system: S1 & S2 heard, RRR. No JVD, murmurs, rubs, gallops or clicks. No pedal edema. Gastrointestinal system: Abdomen is soft, nontender, nondistended, positive bowel sounds.  No rebound.  No guarding.  Central nervous system: Alert and oriented. No focal neurological deficits. Extremities: Symmetric 5 x 5 power. Skin: No rashes, lesions or ulcers Psychiatry: Judgement and insight appear normal. Mood &  affect appropriate.     Data Reviewed: I have personally reviewed following labs and imaging studies  CBC: Recent Labs  Lab 09/10/17 1403 09/11/17 0545  WBC 8.6 8.1  HGB 11.6* 9.5*  HCT 34.1* 27.9*  MCV 91.2 91.5  PLT 307 260   Basic Metabolic Panel: Recent Labs  Lab 09/10/17 1403 09/10/17 1904 09/11/17 0545  NA 135  --  136  K 3.6  --  3.4*  CL 102  --  107  CO2 24  --  22  GLUCOSE 119*  --  92  BUN 23  --  23  CREATININE 1.22*  --  1.05*  CALCIUM 9.0  --  8.3*  MG  --  1.9  --    GFR: Estimated Creatinine Clearance: 31 mL/min (A) (by C-G formula based on SCr of 1.05 mg/dL (H)). Liver Function Tests: No results for input(s): AST, ALT, ALKPHOS, BILITOT, PROT, ALBUMIN in the last 168 hours. No results for input(s): LIPASE, AMYLASE in the last 168 hours. No results for input(s): AMMONIA in the last 168 hours. Coagulation Profile: No results for input(s): INR, PROTIME in the last 168 hours. Cardiac Enzymes: Recent Labs  Lab 09/10/17 1904 09/11/17 0005 09/11/17 0545  TROPONINI <0.03 <0.03 <0.03   BNP (last 3 results) No results for input(s): PROBNP in the last 8760 hours. HbA1C: No results for input(s): HGBA1C in the last 72 hours. CBG: Recent Labs  Lab 09/10/17 1323 09/11/17 0710  GLUCAP 103* 91   Lipid Profile: Recent Labs    09/11/17 0545  CHOL 113  HDL 40*  LDLCALC 62  TRIG 54  CHOLHDL 2.8   Thyroid Function Tests: Recent Labs    09/10/17 1403  TSH 7.660*   Anemia Panel: No results for input(s): VITAMINB12, FOLATE, FERRITIN, TIBC, IRON, RETICCTPCT in the last 72 hours. Sepsis Labs: No results for input(s): PROCALCITON, LATICACIDVEN in the last 168 hours.  No results found for this or any previous visit (from the past 240 hour(s)).       Radiology Studies: Dg Chest 2 View  Result Date: 09/10/2017 CLINICAL DATA:  Per EMS, pt from home here for syncopal episode. Pt alert and oriented x 4. Family states the pt seemed to pass out  while in her chair, family members then helped the pt down to the floor. Pt denies injury or pain. EXAM: CHEST - 2 VIEW COMPARISON:  10/26/7 FINDINGS: Cardiac silhouette is normal in size. No mediastinal or hilar masses. No evidence of adenopathy. Prominent bronchovascular markings in the lung bases. Lungs otherwise clear. No pleural effusion or pneumothorax. Skeletal structures are demineralized but grossly intact. IMPRESSION: No active cardiopulmonary disease. Electronically Signed   By: Amie Portland M.D.   On: 09/10/2017 16:51   Ct Head Wo Contrast  Result Date: 09/10/2017 CLINICAL DATA:  84 year old female with syncope. EXAM: CT HEAD WITHOUT CONTRAST TECHNIQUE: Contiguous axial images were obtained from the base of the skull through the vertex without intravenous contrast. COMPARISON:  Head CT without contrast 12/23/2015 and brain MRI, intracranial MRA 12/24/2015. FINDINGS: Brain: Stable and  normal for age cerebral volume. Chronic dystrophic basal ganglia calcifications incidentally noted. No midline shift, ventriculomegaly, mass effect, evidence of mass lesion, intracranial hemorrhage or evidence of cortically based acute infarction. Gray-white matter differentiation is stable and normal for age throughout the brain. No cortical encephalomalacia identified. Vascular: Mild Calcified atherosclerosis at the skull base. No suspicious intracranial vascular hyperdensity. Skull: Stable, negative. Sinuses/Orbits: Visualized paranasal sinuses and mastoids are stable and well pneumatized. Other: Stable and negative visible orbit and scalp soft tissues. IMPRESSION: Stable since 2017 and negative for age non contrast CT appearance of the brain. Electronically Signed   By: Odessa Fleming M.D.   On: 09/10/2017 17:46        Scheduled Meds: . aspirin EC  81 mg Oral Daily  . enoxaparin (LOVENOX) injection  30 mg Subcutaneous Q24H  . predniSONE  2.5 mg Oral Once  . [START ON 09/12/2017] predniSONE  5 mg Oral Daily  .  sodium chloride flush  3 mL Intravenous Q12H   Continuous Infusions: . sodium chloride 75 mL/hr at 09/11/17 0900     LOS: 0 days    Time spent: 35 minutes    Ramiro Harvest, MD [facility] Pager [phone]  If 7PM-7AM, please contact night-coverage [email or web address] Password [facility] 09/11/2017, 11:31 AM

## 2017-09-11 NOTE — Evaluation (Signed)
Occupational Therapy Evaluation Patient Details Name: Yvette Lozano MRN: [id number] DOB: 09-23-1925 Today's Date: 09/11/2017    History of Present Illness  Yvette Lozano is a 84 y.o. female with medical history significant of CHF, hypertension, hyperlipidemia, prior history of syncope per patient, on diuretics presented to the ED with a syncopal episode.    Clinical Impression   This 84 y/o female presents with the above. At baseline pt is mod independent with functional mobility, receives assist from daughter for bathing ADLs. Pt demonstrating room level functional mobility using RW this session with overall minA, requiring increased cues for maintaining close proximity to RW and for safety/safe RW use. Currently requires modA for toileting and LB ADLs, minA for UB ADL. Pt appearing faint/fatigued when exiting bathroom this session and returning to recliner, BP taken and 113/68, otherwise denies dizziness/lightheadedness during session. Pt will benefit from continued acute OT services and recommend follow up HHOT services to maximize her overall safety and independence with ADLs and mobility after return home.     Follow Up Recommendations  Home health OT;Supervision/Assistance - 24 hour    Equipment Recommendations  None recommended by OT           Precautions / Restrictions Precautions Precautions: Fall Restrictions Weight Bearing Restrictions: No      Mobility Bed Mobility Overal bed mobility: Modified Independent                Transfers Overall transfer level: Needs assistance Equipment used: Rolling walker (2 wheeled) Transfers: Sit to/from Stand Sit to Stand: Min assist;Mod assist         General transfer comment: for balance/safety; cueing for safety; minA to rise from EOB; modA from lower surface height of toilet     Balance Overall balance assessment: Needs assistance   Sitting balance-Leahy Scale: Good     Standing balance support: Bilateral upper  extremity supported Standing balance-Leahy Scale: Poor Standing balance comment: UE support in static standing                            ADL either performed or assessed with clinical judgement   ADL Overall ADL's : Needs assistance/impaired Eating/Feeding: Independent;Sitting   Grooming: Minimal assistance;Standing;Oral care;Wash/dry hands   Upper Body Bathing: Min guard;Sitting   Lower Body Bathing: Minimal assistance;Sit to/from stand   Upper Body Dressing : Minimal assistance;Sitting Upper Body Dressing Details (indicate cue type and reason): doffing/donning new gown  Lower Body Dressing: Moderate assistance;Sit to/from stand   Toilet Transfer: Minimal assistance;Ambulation;Regular Toilet;RW;Cueing for safety;Cueing for sequencing;Grab bars Toilet Transfer Details (indicate cue type and reason): increased cues and assist to ensure safe transfer as pt attempting to leave RW behind when approaching to turn and sit on toilet  Toileting- Clothing Manipulation and Hygiene: Minimal assistance;Moderate assistance;Sit to/from stand Toileting - Clothing Manipulation Details (indicate cue type and reason): min-modA for standing balance while pt performs peri-care, assist for gown management     Functional mobility during ADLs: Minimal assistance;Rolling walker General ADL Comments: pt ambulating to bathroom to complete toileting and standing grooming ADLs; when exiting bathroom pt appearing to have increased fatigue and increasingly unsteady (questionable syncopal signs), able to safely transfer to sitting in recliner, BP taken and 113/68.                          Pertinent Vitals/Pain Pain Assessment: Faces Faces Pain Scale: Hurts little more Pain Location:  L lower leg Pain Descriptors / Indicators: Sore Pain Intervention(s): Monitored during session;Limited activity within patient's tolerance     Hand Dominance     Extremity/Trunk Assessment Upper Extremity  Assessment Upper Extremity Assessment: Overall WFL for tasks assessed   Lower Extremity Assessment Lower Extremity Assessment: Defer to PT evaluation   Cervical / Trunk Assessment Cervical / Trunk Assessment: Kyphotic Cervical / Trunk Exceptions: scoliotic R hip higher   Communication Communication Communication: No difficulties   Cognition Arousal/Alertness: Awake/alert Behavior During Therapy: WFL for tasks assessed/performed Overall Cognitive Status: Within Functional Limits for tasks assessed                                     General Comments  pt's daughter present during session     Exercises     Shoulder Instructions      Home Living Family/patient expects to be discharged to:: Private residence Living Arrangements: Children Available Help at Discharge: Family;Available 24 hours/day Type of Home: House Home Access: Stairs to enter Entergy CorporationEntrance Stairs-Number of Steps: 2-3 Entrance Stairs-Rails: Right Home Layout: One level     Bathroom Shower/Tub: Chief Strategy OfficerTub/shower unit   Bathroom Toilet: Standard     Home Equipment: Environmental consultantWalker - 2 wheels;Bedside commode;Shower seat;Grab bars - tub/shower          Prior Functioning/Environment Level of Independence: Needs assistance  Gait / Transfers Assistance Needed: has help when negotiating stairs ADL's / Homemaking Assistance Needed: daughter helps with showering and for tub transfer             OT Problem List: Decreased strength;Impaired balance (sitting and/or standing);Decreased knowledge of use of DME or AE;Decreased activity tolerance      OT Treatment/Interventions: Self-care/ADL training;DME and/or AE instruction;Therapeutic activities;Balance training;Patient/family education;Energy conservation    OT Goals(Current goals can be found in the care plan section) Acute Rehab OT Goals Patient Stated Goal: none stated  OT Goal Formulation: With patient Time For Goal Achievement: 09/25/17 Potential to  Achieve Goals: Good  OT Frequency: Min 2X/week   Barriers to D/C:            Co-evaluation              AM-PAC PT "6 Clicks" Daily Activity     Outcome Measure Help from another person eating meals?: None Help from another person taking care of personal grooming?: A Little Help from another person toileting, which includes using toliet, bedpan, or urinal?: A Little Help from another person bathing (including washing, rinsing, drying)?: A Lot Help from another person to put on and taking off regular upper body clothing?: A Little Help from another person to put on and taking off regular lower body clothing?: A Lot 6 Click Score: 17   End of Session Equipment Utilized During Treatment: Gait belt;Rolling walker Nurse Communication: Mobility status  Activity Tolerance: Patient tolerated treatment well Patient left: in chair;with call bell/phone within reach;with chair alarm set;with family/visitor present  OT Visit Diagnosis: Unsteadiness on feet (R26.81)                Time: [id number] OT Time Calculation (min): 34 min Charges:  OT General Charges $OT Visit: 1 Visit OT Evaluation $OT Eval Moderate Complexity: 1 Mod OT Treatments $Self Care/Home Management : 8-22 mins G-Codes:     Marcy SirenBreanna Zayvion Stailey, OT Pager [phone] 09/11/2017  Orlando PennerBreanna L Dillon Mcreynolds 09/11/2017, 1:35 PM

## 2017-09-11 NOTE — Progress Notes (Signed)
  Echocardiogram 2D Echocardiogram has been performed.  Leta JunglingCooper, Tou Hayner M 09/11/2017, 9:37 AM

## 2017-09-11 NOTE — Care Management Obs Status (Signed)
MEDICARE OBSERVATION STATUS NOTIFICATION   Patient Details  Name: Yvette Lozano MRN: [id number] Date of Birth: 1925-10-24   Medicare Observation Status Notification Given:  Yes    MahabirOlegario Messier, Shatika Grinnell, RN 09/11/2017, 12:25 PM

## 2017-09-11 NOTE — Progress Notes (Signed)
Nutrition Brief Note  Patient identified on the Malnutrition Screening Tool (MST) Report  Wt Readings from Last 15 Encounters:  09/11/17 153 lb 14.1 oz (69.8 kg)  12/25/15 160 lb 15 oz (73 kg)    Body mass index is 30.05 kg/m. Patient meets criteria for obesity based on current BMI. Skin WDL. Current diet order is Heart Healthy and patient consumed 75% of breakfast this AM (446 kcal, 11 grams of protein).  Patient with hx of CHF, HTN, hyperlipidemia, and syncopal events. Patient was admitted for OBS after a syncopal episode at home, questionable etiology, head CT negative, plan to check TSH.   Medications reviewed; 40 mEq oral KCl x1 dose today, 2.5 mg Deltasone x1 today, 5 mg Deltasone/day starting tomorrow.  Labs reviewed; CBG: 91 mg/dL, K: 3.4 mmol/L, creatinine: 1.05 mg/dL, Ca: 8.3 mg/dL, GFR: 45 mL/min.   IVF: NS @ 75 mL/hr.   No nutrition interventions warranted at this time. If nutrition issues arise, please consult RD.     Yvette GammonJessica Samule Life, MS, RD, LDN, [facility] Inpatient Clinical Dietitian Pager # [phone] After hours/weekend pager # [phone]

## 2017-09-11 NOTE — Consult Note (Signed)
Reason for Consult: Syncope Referring Physician: Syncope  Yvette Lozano is an 84 y.o. female.  HPI:   84 y/o [place] American female with hypertension, arhritis, ? H/o CHF. Patient lives at home with her son and daughter. She ambulates inside the house using a walker. History of syncopal episode is limited to patient's recollection of events. No collateral history available. Patient came out of the bathroom and was sitting on her chair awaiting physical therapist, is when she lost consciousness and was found down on the floor and taken to ED. She does not recollect having had any chest pain, shortness of breath, palpitations, lightheadedness before or after the syncopal episode. No symptoms of seizures. Initial workup did not show any obvious arrhthymias, or ischemic changes on EKG. Patient recollects similar episode in ?1917 (probably 2017). Cardiac workup fairly unremarkable, thus far.    Past Medical History:  Diagnosis Date  . Arthritis   . CHF (congestive heart failure) ([place])   . Hypertension     Past Surgical History:  Procedure Laterality Date  . ABDOMINAL HYSTERECTOMY      History reviewed. No pertinent family history.  Social History:  reports that she has never smoked. She has never used smokeless tobacco. She reports that she does not drink alcohol or use drugs.  Allergies: No Known Allergies  Medications: I have reviewed the patient's current medications.  Results for orders placed or performed during the hospital encounter of 09/10/17 (from the past 48 hour(s))  CBG monitoring, ED     Status: Abnormal   Collection Time: 09/10/17  1:23 PM  Result Value Ref Range   Glucose-Capillary 103 (H) 70 - 99 mg/dL  Basic metabolic panel     Status: Abnormal   Collection Time: 09/10/17  2:03 PM  Result Value Ref Range   Sodium 135 135 - 145 mmol/L   Potassium 3.6 3.5 - 5.1 mmol/L   Chloride 102 98 - 111 mmol/L    Comment: Please note change in reference range.   CO2 24 22 -  32 mmol/L   Glucose, Bld 119 (H) 70 - 99 mg/dL    Comment: Please note change in reference range.   BUN 23 8 - 23 mg/dL    Comment: Please note change in reference range.   Creatinine, Ser 1.22 (H) 0.44 - 1.00 mg/dL   Calcium 9.0 8.9 - 10.3 mg/dL   GFR calc non Af Amer 38 (L) >60 mL/min   GFR calc Af Amer 44 (L) >60 mL/min    Comment: (NOTE) The eGFR has been calculated using the CKD EPI equation. This calculation has not been validated in all clinical situations. eGFR's persistently <60 mL/min signify possible Chronic Kidney Disease.    Anion gap 9 5 - 15    Comment: Performed at [facility], [place] [address] [place], [place] [address]  CBC     Status: Abnormal   Collection Time: 09/10/17  2:03 PM  Result Value Ref Range   WBC 8.6 4.0 - 10.5 K/uL   RBC 3.74 (L) 3.87 - 5.11 MIL/uL   Hemoglobin 11.6 (L) 12.0 - 15.0 g/dL   HCT 34.1 (L) 36.0 - 46.0 %   MCV 91.2 78.0 - 100.0 fL   MCH 31.0 26.0 - 34.0 pg   MCHC 34.0 30.0 - 36.0 g/dL   RDW 13.7 11.5 - 15.5 %   Platelets 307 150 - 400 K/uL    Comment: Performed at [facility], [place] [address] [place],  [place] [address]  TSH     Status: Abnormal   Collection Time: 09/10/17  2:03 PM  Result Value Ref Range   TSH 7.660 (H) 0.350 - 4.500 uIU/mL    Comment: Performed by a 3rd Generation assay with a functional sensitivity of <=0.01 uIU/mL. Performed at [facility], [place] [address], [place], [place] [address]   I-Stat Troponin, ED (not at [facility])     Status: None   Collection Time: 09/10/17  3:31 PM  Result Value Ref Range   Troponin i, poc 0.00 0.00 - 0.08 ng/mL   Comment 3            Comment: Due to the release kinetics of cTnI, a negative result within the first hours of the onset of symptoms does not rule out myocardial infarction with certainty. If myocardial infarction is still suspected, repeat the test at appropriate intervals.   Magnesium     Status: None    Collection Time: 09/10/17  7:04 PM  Result Value Ref Range   Magnesium 1.9 1.7 - 2.4 mg/dL    Comment: Performed at [facility], [place] [address], [place], [place] [address]  Troponin I (q 6hr x 3)     Status: None   Collection Time: 09/10/17  7:04 PM  Result Value Ref Range   Troponin I <0.03 <0.03 ng/mL    Comment: Performed at [facility], [place] [address] [place], [place] [address]  Troponin I (q 6hr x 3)     Status: None   Collection Time: 09/11/17 12:05 AM  Result Value Ref Range   Troponin I <0.03 <0.03 ng/mL    Comment: Performed at [facility], [place] [address], [place], [place] [address]  Troponin I (q 6hr x 3)     Status: None   Collection Time: 09/11/17  5:45 AM  Result Value Ref Range   Troponin I <0.03 <0.03 ng/mL    Comment: Performed at [facility], [place] [address], [place], [place] [address]  Lipid panel     Status: Abnormal   Collection Time: 09/11/17  5:45 AM  Result Value Ref Range   Cholesterol 113 0 - 200 mg/dL   Triglycerides 54 <150 mg/dL   HDL 40 (L) >40 mg/dL   Total CHOL/HDL Ratio 2.8 RATIO   VLDL 11 0 - 40 mg/dL   LDL Cholesterol 62 0 - 99 mg/dL    Comment:        Total Cholesterol/HDL:CHD Risk Coronary Heart Disease Risk Table                     Men   Women  1/2 Average Risk   3.4   3.3  Average Risk       5.0   4.4  2 X Average Risk   9.6   7.1  3 X Average Risk  23.4   11.0        Use the calculated Patient Ratio above and the CHD Risk Table to determine the patient's CHD Risk.        ATP III CLASSIFICATION (LDL):  <100     mg/dL   Optimal  100-129  mg/dL   Near or Above                    Optimal  130-159  mg/dL   Borderline  160-189  mg/dL   High  >190     mg/dL  Very High Performed at [place] [address], [place], [place] [address]   Basic metabolic panel     Status: Abnormal   Collection Time: 09/11/17  5:45 AM  Result Value Ref  Range   Sodium 136 135 - 145 mmol/L   Potassium 3.4 (L) 3.5 - 5.1 mmol/L   Chloride 107 98 - 111 mmol/L    Comment: Please note change in reference range.   CO2 22 22 - 32 mmol/L   Glucose, Bld 92 70 - 99 mg/dL    Comment: Please note change in reference range.   BUN 23 8 - 23 mg/dL    Comment: Please note change in reference range.   Creatinine, Ser 1.05 (H) 0.44 - 1.00 mg/dL   Calcium 8.3 (L) 8.9 - 10.3 mg/dL   GFR calc non Af Amer 45 (L) >60 mL/min   GFR calc Af Amer 53 (L) >60 mL/min    Comment: (NOTE) The eGFR has been calculated using the CKD EPI equation. This calculation has not been validated in all clinical situations. eGFR's persistently <60 mL/min signify possible Chronic Kidney Disease.    Anion gap 7 5 - 15    Comment: Performed at [facility], [place] [address] [place], [place] [address]  CBC     Status: Abnormal   Collection Time: 09/11/17  5:45 AM  Result Value Ref Range   WBC 8.1 4.0 - 10.5 K/uL   RBC 3.05 (L) 3.87 - 5.11 MIL/uL   Hemoglobin 9.5 (L) 12.0 - 15.0 g/dL   HCT 27.9 (L) 36.0 - 46.0 %   MCV 91.5 78.0 - 100.0 fL   MCH 31.1 26.0 - 34.0 pg   MCHC 34.1 30.0 - 36.0 g/dL   RDW 13.7 11.5 - 15.5 %   Platelets 260 150 - 400 K/uL    Comment: Performed at [facility], [place] [address], [place],  [address]  Glucose, capillary     Status: None   Collection Time: 09/11/17  7:10 AM  Result Value Ref Range   Glucose-Capillary 91 70 - 99 mg/dL    Dg Chest 2 View  Result Date: 09/10/2017 CLINICAL DATA:  Per EMS, pt from home here for syncopal episode. Pt alert and oriented x 4. Family states the pt seemed to pass out while in her chair, family members then helped the pt down to the floor. Pt denies injury or pain. EXAM: CHEST - 2 VIEW COMPARISON:  10/26/7 FINDINGS: Cardiac silhouette is normal in size. No mediastinal or hilar masses. No evidence of adenopathy. Prominent bronchovascular markings in the lung bases. Lungs  otherwise clear. No pleural effusion or pneumothorax. Skeletal structures are demineralized but grossly intact. IMPRESSION: No active cardiopulmonary disease. Electronically Signed   By: Lajean Manes M.D.   On: 09/10/2017 16:51   Ct Head Wo Contrast  Result Date: 09/10/2017 CLINICAL DATA:  84 year old female with syncope. EXAM: CT HEAD WITHOUT CONTRAST TECHNIQUE: Contiguous axial images were obtained from the base of the skull through the vertex without intravenous contrast. COMPARISON:  Head CT without contrast 12/23/2015 and brain MRI, intracranial MRA 12/24/2015. FINDINGS: Brain: Stable and normal for age cerebral volume. Chronic dystrophic basal ganglia calcifications incidentally noted. No midline shift, ventriculomegaly, mass effect, evidence of mass lesion, intracranial hemorrhage or evidence of cortically based acute infarction. Gray-white matter differentiation is stable and normal for age throughout the brain. No cortical encephalomalacia identified. Vascular: Mild Calcified atherosclerosis at the skull base. No suspicious intracranial  vascular hyperdensity. Skull: Stable, negative. Sinuses/Orbits: Visualized paranasal sinuses and mastoids are stable and well pneumatized. Other: Stable and negative visible orbit and scalp soft tissues. IMPRESSION: Stable since 2017 and negative for age non contrast CT appearance of the brain. Electronically Signed   By: Genevie Ann M.D.   On: 09/10/2017 17:46    Cardiac studies: EKG 09/11/2017: Sinus rhythm 73 bpm. Normal axis. Incomplete RBBB. No ischemic changes.  Suspect lead reversal on EKG 09/11/2017.  Echocardiogram 09/11/2017: Study Conclusions  - Left ventricle: The cavity size was normal. Wall thickness was   normal. Systolic function was normal. The estimated ejection   fraction was in the range of 60% to 65%. Wall motion was normal;   there were no regional wall motion abnormalities. Doppler   parameters are consistent with abnormal left  ventricular   relaxation (grade 1 diastolic dysfunction). Doppler parameters   are consistent with high ventricular filling pressure. - Aortic valve: There was trivial regurgitation. - Pulmonary arteries: Systolic pressure was mildly increased. PA   peak pressure: 34 mm Hg (S). - Pericardium, extracardiac: A trivial pericardial effusion was   identified.  Impressions:  - Normal LV systolic function; mild diastolic dysfunction; trace   AI. trace TR with mild pulmonary hypertension.  Vascular ultrasound 12/24/2015: Summary:  - The vertebral arteries appear patent with antegrade flow. - Findings consistent with 1- 39 percent stenosis involving the right internal carotid artery and the left internal carotid artery.   Review of Systems  Constitutional: Negative.        Generalized weakness  HENT: Negative.   Respiratory: Negative for shortness of breath.   Cardiovascular: Negative for chest pain, orthopnea and leg swelling.  Gastrointestinal: Negative.   Genitourinary: Negative.   Musculoskeletal: Positive for joint pain.  Skin: Negative.   Neurological: Positive for loss of consciousness. Negative for dizziness.  Psychiatric/Behavioral: Negative.   All other systems reviewed and are negative.  Blood pressure 103/72, pulse 76, temperature (!) 97.5 F (36.4 C), temperature source Oral, resp. rate 16, height 5' (1.524 m), weight 69.8 kg (153 lb 14.1 oz), SpO2 99 %. Physical Exam  Nursing note and vitals reviewed. Constitutional: She is oriented to person, place, and time. She appears well-developed and well-nourished.  Appears younger than stated age  HENT:  Head: Atraumatic.  Eyes: Pupils are equal, round, and reactive to light. Conjunctivae are normal.  Neck: Normal range of motion. Neck supple. No JVD present.  Cardiovascular: Normal rate, regular rhythm and normal heart sounds.  No murmur heard. Intact distal pulses   Respiratory: Effort normal.  GI: Soft. Bowel  sounds are normal. She exhibits no distension. There is no tenderness.  Musculoskeletal: She exhibits edema.  Lymphadenopathy:    She has no cervical adenopathy.  Neurological: She is alert and oriented to person, place, and time. No cranial nerve deficit.  Skin: Skin is warm and dry.  Psychiatric: She has a normal mood and affect.    Assessment/Recommendations: 84 y/o [place] American female with syncopal episode  Syncope: No obvious cardiogenic/arrhtymogenic etiology identified, although bradyarrhtymias remain in differential given her advanced age. No evidence of myocardial ischemia/infarction.. Recommend event monitor on discharge. Will set this up through our office. Avoid diuretics. antihypertensives at this time.   Anemia:  Consider repeating H/H.  Will see her outpatient.   Manish J Patwardhan 09/11/2017, 4:33 PM   Manish Esther Hardy, MD [facility] [place] Pager: [phone] Office: [phone] If no answer Cell [phone]

## 2017-09-12 DIAGNOSIS — I5032 Chronic diastolic (congestive) heart failure: Secondary | ICD-10-CM | POA: Diagnosis not present

## 2017-09-12 DIAGNOSIS — I10 Essential (primary) hypertension: Secondary | ICD-10-CM | POA: Diagnosis not present

## 2017-09-12 DIAGNOSIS — E785 Hyperlipidemia, unspecified: Secondary | ICD-10-CM | POA: Diagnosis not present

## 2017-09-12 DIAGNOSIS — R55 Syncope and collapse: Secondary | ICD-10-CM | POA: Diagnosis not present

## 2017-09-12 LAB — CBC
HEMATOCRIT: 28.2 % — AB (ref 36.0–46.0)
Hemoglobin: 9.5 g/dL — ABNORMAL LOW (ref 12.0–15.0)
MCH: 30.8 pg (ref 26.0–34.0)
MCHC: 33.7 g/dL (ref 30.0–36.0)
MCV: 91.6 fL (ref 78.0–100.0)
Platelets: 282 10*3/uL (ref 150–400)
RBC: 3.08 MIL/uL — ABNORMAL LOW (ref 3.87–5.11)
RDW: 13.8 % (ref 11.5–15.5)
WBC: 6.8 10*3/uL (ref 4.0–10.5)

## 2017-09-12 LAB — MAGNESIUM: Magnesium: 1.7 mg/dL (ref 1.7–2.4)

## 2017-09-12 LAB — FOLATE: Folate: 9.4 ng/mL (ref >5.9)

## 2017-09-12 LAB — BASIC METABOLIC PANEL
ANION GAP: 8 (ref 5–15)
BUN: 16 mg/dL (ref 8–23)
CALCIUM: 8.5 mg/dL — AB (ref 8.9–10.3)
CO2: 22 mmol/L (ref 22–32)
CREATININE: 0.92 mg/dL (ref 0.44–1.00)
Chloride: 108 mmol/L (ref 98–111)
GFR calc non Af Amer: 53 mL/min — ABNORMAL LOW (ref >60)
Glucose, Bld: 90 mg/dL (ref 70–99)
Potassium: 3.9 mmol/L (ref 3.5–5.1)
SODIUM: 138 mmol/L (ref 135–145)

## 2017-09-12 LAB — IRON AND TIBC
Iron: 60 ug/dL (ref 28–170)
Saturation Ratios: 35 % — ABNORMAL HIGH (ref 10.4–31.8)
TIBC: 172 ug/dL — ABNORMAL LOW (ref 250–450)
UIBC: 112 ug/dL

## 2017-09-12 LAB — VITAMIN B12: Vitamin B-12: 90 pg/mL — ABNORMAL LOW (ref 180–914)

## 2017-09-12 LAB — GLUCOSE, CAPILLARY: GLUCOSE-CAPILLARY: 85 mg/dL (ref 70–99)

## 2017-09-12 LAB — FERRITIN: FERRITIN: 174 ng/mL (ref 11–307)

## 2017-09-12 MED ORDER — MAGNESIUM SULFATE 2 GM/50ML IV SOLN
2.0000 g | Freq: Once | INTRAVENOUS | Status: AC
  Administered 2017-09-12: 2 g via INTRAVENOUS
  Filled 2017-09-12: qty 50

## 2017-09-12 MED ORDER — ENOXAPARIN SODIUM 40 MG/0.4ML ~~LOC~~ SOLN: 40.0000 mg | SUBCUTANEOUS | Status: DC

## 2017-09-12 NOTE — Progress Notes (Signed)
Occupational Therapy Treatment Patient Details Name: Yvette Lozano MRN: [id number] DOB: Jul 17, 1925 Today's Date: 09/12/2017    History of present illness  Yvette Lozano is a 84 y.o. female with medical history significant of CHF, hypertension, hyperlipidemia, prior history of syncope per patient, on diuretics presented to the ED with a syncopal episode.    OT comments  Pt making good progress. Fatiques easily and initiates rest breaks. Plans home today  Follow Up Recommendations  Home health OT;Supervision/Assistance - 24 hour    Equipment Recommendations  None recommended by OT    Recommendations for Other Services      Precautions / Restrictions Precautions Precautions: Fall Restrictions Weight Bearing Restrictions: No       Mobility Bed Mobility Overal bed mobility: Modified Independent                Transfers   Equipment used: Rolling walker (2 wheeled)   Sit to Stand: Min assist         General transfer comment: steadying assistance    Balance                                           ADL either performed or assessed with clinical judgement   ADL                   Upper Body Dressing : Set up;Sitting   Lower Body Dressing: Minimal assistance;Sit to/from stand(underwear and skirt only)   Toilet Transfer: Minimal assistance;Ambulation;RW(walked around bed to chair)             General ADL Comments: pt took 2 seated rest breaks when dressing; fatiques easily. She plans home today.  Min A for sit to stand for steadying     Vision       Perception     Praxis      Cognition Arousal/Alertness: Awake/alert Behavior During Therapy: WFL for tasks assessed/performed Overall Cognitive Status: Within Functional Limits for tasks assessed                                          Exercises     Shoulder Instructions       General Comments      Pertinent Vitals/ Pain       Pain Assessment:  No/denies pain  Home Living                                          Prior Functioning/Environment              Frequency  Min 2X/week        Progress Toward Goals  OT Goals(current goals can now be found in the care plan section)  Progress towards OT goals: Progressing toward goals     Plan      Co-evaluation                 AM-PAC PT "6 Clicks" Daily Activity     Outcome Measure   Help from another person eating meals?: None Help from another person taking care of personal grooming?: A Little Help from another person toileting, which includes using toliet, bedpan, or urinal?: A  Little Help from another person bathing (including washing, rinsing, drying)?: A Little Help from another person to put on and taking off regular upper body clothing?: A Little Help from another person to put on and taking off regular lower body clothing?: A Lot 6 Click Score: 18    End of Session Equipment Utilized During Treatment: Gait belt;Rolling walker  OT Visit Diagnosis: Unsteadiness on feet (R26.81)   Activity Tolerance Patient tolerated treatment well   Patient Left in chair;with call bell/phone within reach;with chair alarm set   Nurse Communication          Time: [id number] OT Time Calculation (min): 20 min  Charges: OT General Charges $OT Visit: 1 Visit OT Treatments $Self Care/Home Management : 8-22 mins  Yvette Lozano, OTR/L [phone] 09/12/2017   Yvette Lozano 09/12/2017, 9:54 AM

## 2017-09-12 NOTE — Care Management Note (Signed)
Case Management Note  Patient Details  Name: Yvette Lozano MRN: [id number] Date of Birth: 08-18-1925  Subjective/Objective: Spoke to [facility] rep Dawn-aware of d/c & HHC orders. No further CM needs.                  Action/Plan:d/c home w/HHC.   Expected Discharge Date:  09/12/17               Expected Discharge Plan:  Home w Home Health Services  In-House Referral:     Discharge planning Services  CM Consult  Post Acute Care Choice:  Durable Medical Equipment(rw;Active w/[facility]/PT/aide) Choice offered to:  Patient  DME Arranged:    DME Agency:     HH Arranged:  RN, PT, OT, Nurse's Aide HH Agency:  [facility]  Status of Service:  Completed, signed off  If discussed at [employer] Length of Stay Meetings, dates discussed:    Additional Comments:  Lanier ClamMahabir, Alfonso Carden, RN 09/12/2017, 9:50 AM

## 2017-09-12 NOTE — Discharge Summary (Signed)
Physician Discharge Summary  Yvette Evangelistda M Mamone ZOX:[id number] DOB: February 19, 1926 DOA: 09/10/2017  PCP: Renaye RakersBland, Veita, MD  Admit date: 09/10/2017 Discharge date: 09/12/2017  Time spent: 60 minutes  Recommendations for Outpatient Follow-up:  1. Follow-up with Renaye RakersBland, Veita, MD in 2 to 3 weeks.  On follow-up patient will need a repeat TSH done, basic metabolic profile to follow-up on electrolytes and renal function, magnesium level, and a CBC to follow-up on H&H.Marland Kitchen.  Patient's antihypertensive medications and diuretics were discontinued during this hospitalization and blood pressure need to be reassessed on follow-up. 2. Follow-up with Dr. Rosemary HolmsPatwardhan, cardiology in 1 week for follow-up on syncope.  Cardiologist office will set patient up for event monitor.   Discharge Diagnoses:  Principal Problem:   Syncope Active Problems:   Congestive heart failure (HCC)   Essential hypertension   Hyperlipidemia   Discharge Condition: Stable and improved  Diet recommendation: Heart healthy  Filed Weights   09/10/17 1419 09/11/17 0517  Weight: 69.9 kg (154 lb) 69.8 kg (153 lb 14.1 oz)    History of present illness:   HPI: Yvette Lozano is a 84 y.o. female with medical history significant of CHF, hypertension, hyperlipidemia, prior history of syncope per patient, on diuretics presented to the ED with a syncopal episode.  Patient at bedside and states came from the bathroom setting her chair and subsequently passed out.  States she is not sure what happened and all she knows is that the paramedics got there.  No family at bedside and as such history obtained from EDP's note.  Per EDP patient was sitting down at the table and subsequently slumped over was completely unresponsive but breathing.  911 was called and patient lowered to the ground.  Patient regained consciousness about 5 minutes later.  No generalized shaking activity noted.  No tongue biting.  No loss of bowel or bladder function.  Patient with nearly  returned to her baseline mental status.  Patient denies any lightheadedness or dizziness.  No preceding chest pain or shortness of breath.  No palpitations.  Patient denies any fevers, no chills, no nausea, no vomiting, no abdominal pain, no diarrhea, no constipation, no melena, no hematemesis, no hematochezia.  No visual changes.  No asymmetric weakness or numbness.  No slurred speech.  Patient does complain of feeling cold all the time.  ED Course: Patient seen in the ED chest x-ray which was obtained was unremarkable.  Head CT which was obtained was negative for any acute abnormalities.  Basic metabolic profile obtained with a glucose of 119, creatinine of 1.22 otherwise is within normal limits.  Point-of-care troponin negative.  CBC with a white count of 11.6 otherwise was within normal limits.  EKG with a normal sinus rhythm.  Patient noted on monitor in the ED to have systolic blood pressures in the high 80s and mostly in the 90s.  Patient given a liter bolus of normal saline.  Hospitalist were called to admit the patient.     Hospital Course:  1 syncope Concern for cardiac etiology.  Patient noted to be seated when she syncopized.  Patient denied any prodrome, no lightheadedness or dizziness.  In the ED patient noted to have systolic blood pressures in the 90s.  Patient also noted to be on a calcium channel blocker, beta-blocker, ACE inhibitor and diuretics.  Patient's ACE inhibitor, calcium channel blocker, beta-blocker and diuretics were held throughout the hospitalization.  Patient with no further syncopal episodes.  Cardiac enzymes were negative x3.   TSH at  7.660.  2D echo obtained with a EF of 60 to 65%, no wall motion abnormalities, grade 1 diastolic dysfunction, trivial aortic valvular regurgitation, mildly increased pulmonary artery systolic pressure.  Patient was placed on TED hose with gentle hydration.  Cardiology was consulted, Dr. Rosemary Holms, who assessed the patient and recommended  event monitor on discharge which will be set up through cardiology's office and to avoid diuretics and antihypertensive medications at this time.  Patient will follow up with cardiology in the outpatient setting.    2.    Chronic diastolic CHF Patient was euvolemic throughout the hospitalization.  Outpatient follow-up.    3.  Hyperlipidemia Fasting lipid panel with a LDL of 62.  4.  Hypertension Blood pressure was borderline supine in the ED.  Patient had presented with a syncopal episode.  Systolic blood pressures on presentation was in the 90s.  Systolic blood pressure improved with hydration such that by day of discharge systolic blood pressure was in the 120s.  Patient's antihypertensive medications including ACE inhibitor, beta-blocker, calcium channel blocker and diuretics were discontinued and will not be resumed on discharge.  Outpatient follow-up with PCP.    5.  Hypokalemia Repleted.  Magnesium noted at 1.7 on day of discharge and patient given some IV magnesium.  Outpatient follow-up.    6.  Anemia of chronic disease Likely dilutional component.  Patient with no overt bleeding.    Anemia panel consistent with anemia of chronic disease.  H&H stable.  Outpatient follow-up with PCP.      Procedures:  2D echo 09/11/2017  Consultations:  Cardiology: Dr. Rosemary Holms 09/11/2017  Discharge Exam: Vitals:   09/11/17 2024 09/12/17 0456  BP: 126/66 125/75  Pulse: 67 80  Resp: 18 18  Temp: 98.4 F (36.9 C) 98.9 F (37.2 C)  SpO2: (!) 76% 99%    General: NAD Cardiovascular: RRR Respiratory: CTAB  Discharge Instructions   Discharge Instructions    Diet - low sodium heart healthy   Complete by:  As directed    Increase activity slowly   Complete by:  As directed      Allergies as of 09/12/2017   No Known Allergies     Medication List    STOP taking these medications   amLODipine-benazepril 10-20 MG capsule Commonly known as:  LOTREL   carvedilol 25 MG  tablet Commonly known as:  COREG   hydrochlorothiazide 25 MG tablet Commonly known as:  HYDRODIURIL     TAKE these medications   aspirin EC 81 MG tablet Take 1 tablet (81 mg total) by mouth daily.   predniSONE 2.5 MG tablet Commonly known as:  DELTASONE Take 2.5 mg by mouth daily.      No Known Allergies Follow-up Information    [facility] Follow up.   Specialty:  Home Health Services Why:  [facility] nursing/physical/occupational therapy/aide Contact information: [address] [place] [address] [phone]        Renaye Rakers, MD. Schedule an appointment as soon as possible for a visit in 2 week(s).   Specialty:  Family Medicine Why:  f/u in 2-3 weeks. Contact information: [address] [place] [address] [phone]        Elder Negus, MD. Schedule an appointment as soon as possible for a visit in 1 week(s).   Specialty:  Cardiology Contact information: [address] [place] [address] [phone]            The results of significant  diagnostics from this hospitalization (including imaging, microbiology, ancillary and laboratory) are listed below for reference.    Significant Diagnostic Studies: Dg Chest 2 View  Result Date: 09/10/2017 CLINICAL DATA:  Per EMS, pt from home here for syncopal episode. Pt alert and oriented x 4. Family states the pt seemed to pass out while in her chair, family members then helped the pt down to the floor. Pt denies injury or pain. EXAM: CHEST - 2 VIEW COMPARISON:  10/26/7 FINDINGS: Cardiac silhouette is normal in size. No mediastinal or hilar masses. No evidence of adenopathy. Prominent bronchovascular markings in the lung bases. Lungs otherwise clear. No pleural effusion or pneumothorax. Skeletal structures are demineralized but grossly intact. IMPRESSION: No active cardiopulmonary disease. Electronically Signed   By: Amie Portland M.D.   On: 09/10/2017 16:51   Ct Head Wo  Contrast  Result Date: 09/10/2017 CLINICAL DATA:  84 year old female with syncope. EXAM: CT HEAD WITHOUT CONTRAST TECHNIQUE: Contiguous axial images were obtained from the base of the skull through the vertex without intravenous contrast. COMPARISON:  Head CT without contrast 12/23/2015 and brain MRI, intracranial MRA 12/24/2015. FINDINGS: Brain: Stable and normal for age cerebral volume. Chronic dystrophic basal ganglia calcifications incidentally noted. No midline shift, ventriculomegaly, mass effect, evidence of mass lesion, intracranial hemorrhage or evidence of cortically based acute infarction. Gray-white matter differentiation is stable and normal for age throughout the brain. No cortical encephalomalacia identified. Vascular: Mild Calcified atherosclerosis at the skull base. No suspicious intracranial vascular hyperdensity. Skull: Stable, negative. Sinuses/Orbits: Visualized paranasal sinuses and mastoids are stable and well pneumatized. Other: Stable and negative visible orbit and scalp soft tissues. IMPRESSION: Stable since 2017 and negative for age non contrast CT appearance of the brain. Electronically Signed   By: Odessa Fleming M.D.   On: 09/10/2017 17:46    Microbiology: No results found for this or any previous visit (from the past 240 hour(s)).   Labs: Basic Metabolic Panel: Recent Labs  Lab 09/10/17 1403 09/10/17 1904 09/11/17 0545 09/12/17 0546  NA 135  --  136 138  K 3.6  --  3.4* 3.9  CL 102  --  107 108  CO2 24  --  22 22  GLUCOSE 119*  --  92 90  BUN 23  --  23 16  CREATININE 1.22*  --  1.05* 0.92  CALCIUM 9.0  --  8.3* 8.5*  MG  --  1.9  --  1.7   Liver Function Tests: No results for input(s): AST, ALT, ALKPHOS, BILITOT, PROT, ALBUMIN in the last 168 hours. No results for input(s): LIPASE, AMYLASE in the last 168 hours. No results for input(s): AMMONIA in the last 168 hours. CBC: Recent Labs  Lab 09/10/17 1403 09/11/17 0545 09/12/17 0546  WBC 8.6 8.1 6.8  HGB  11.6* 9.5* 9.5*  HCT 34.1* 27.9* 28.2*  MCV 91.2 91.5 91.6  PLT 307 260 282   Cardiac Enzymes: Recent Labs  Lab 09/10/17 1904 09/11/17 0005 09/11/17 0545  TROPONINI <0.03 <0.03 <0.03   BNP: BNP (last 3 results) No results for input(s): BNP in the last 8760 hours.  ProBNP (last 3 results) No results for input(s): PROBNP in the last 8760 hours.  CBG: Recent Labs  Lab 09/10/17 1323 09/11/17 0710 09/12/17 0757  GLUCAP 103* 91 85       Signed:  Ramiro Harvest MD.  [facility] 09/12/2017, 9:25 AM

## 2017-09-14 DIAGNOSIS — R55 Syncope and collapse: Secondary | ICD-10-CM | POA: Diagnosis not present

## 2017-09-24 DIAGNOSIS — I951 Orthostatic hypotension: Secondary | ICD-10-CM | POA: Diagnosis not present

## 2017-09-24 DIAGNOSIS — I480 Paroxysmal atrial fibrillation: Secondary | ICD-10-CM | POA: Diagnosis not present

## 2017-09-24 DIAGNOSIS — I10 Essential (primary) hypertension: Secondary | ICD-10-CM | POA: Diagnosis not present

## 2017-09-25 DIAGNOSIS — I10 Essential (primary) hypertension: Secondary | ICD-10-CM | POA: Diagnosis not present

## 2017-09-25 DIAGNOSIS — M170 Bilateral primary osteoarthritis of knee: Secondary | ICD-10-CM | POA: Diagnosis not present

## 2017-09-25 DIAGNOSIS — M6281 Muscle weakness (generalized): Secondary | ICD-10-CM | POA: Diagnosis not present

## 2017-09-25 DIAGNOSIS — R269 Unspecified abnormalities of gait and mobility: Secondary | ICD-10-CM | POA: Diagnosis not present

## 2017-10-02 DIAGNOSIS — I10 Essential (primary) hypertension: Secondary | ICD-10-CM | POA: Diagnosis not present

## 2017-10-02 DIAGNOSIS — R269 Unspecified abnormalities of gait and mobility: Secondary | ICD-10-CM | POA: Diagnosis not present

## 2017-10-02 DIAGNOSIS — M6281 Muscle weakness (generalized): Secondary | ICD-10-CM | POA: Diagnosis not present

## 2017-10-02 DIAGNOSIS — M170 Bilateral primary osteoarthritis of knee: Secondary | ICD-10-CM | POA: Diagnosis not present

## 2017-10-09 DIAGNOSIS — I480 Paroxysmal atrial fibrillation: Secondary | ICD-10-CM | POA: Diagnosis not present

## 2017-10-09 DIAGNOSIS — I10 Essential (primary) hypertension: Secondary | ICD-10-CM | POA: Diagnosis not present

## 2017-10-09 DIAGNOSIS — R627 Adult failure to thrive: Secondary | ICD-10-CM | POA: Diagnosis not present

## 2017-10-10 DIAGNOSIS — R269 Unspecified abnormalities of gait and mobility: Secondary | ICD-10-CM | POA: Diagnosis not present

## 2017-10-10 DIAGNOSIS — M6281 Muscle weakness (generalized): Secondary | ICD-10-CM | POA: Diagnosis not present

## 2017-10-10 DIAGNOSIS — M170 Bilateral primary osteoarthritis of knee: Secondary | ICD-10-CM | POA: Diagnosis not present

## 2017-10-10 DIAGNOSIS — I10 Essential (primary) hypertension: Secondary | ICD-10-CM | POA: Diagnosis not present

## 2017-10-13 DIAGNOSIS — R55 Syncope and collapse: Secondary | ICD-10-CM | POA: Diagnosis not present

## 2017-10-25 ENCOUNTER — Other Ambulatory Visit: Payer: Self-pay

## 2017-10-25 NOTE — Patient Outreach (Signed)
[facility] Care Management  10/25/2017  Yvette Lozano 1925/07/09 [id number]   Medication Adherence call to Mrs. Yvette Lozano patient telephone number is disconnected under [facility] and [facility]. Yvette Lozano is due on Amlodipine/ Beneazepril 10/20 mg.  Under [facility].  Lillia AbedAna Ollison-Moran CPhT Pharmacy Technician [facility] Care Management Direct Dial [phone]  Fax [phone] [email or web address]

## 2017-10-27 DIAGNOSIS — M6281 Muscle weakness (generalized): Secondary | ICD-10-CM | POA: Diagnosis not present

## 2017-10-27 DIAGNOSIS — M170 Bilateral primary osteoarthritis of knee: Secondary | ICD-10-CM | POA: Diagnosis not present

## 2017-10-27 DIAGNOSIS — R269 Unspecified abnormalities of gait and mobility: Secondary | ICD-10-CM | POA: Diagnosis not present

## 2017-10-27 DIAGNOSIS — I10 Essential (primary) hypertension: Secondary | ICD-10-CM | POA: Diagnosis not present

## 2017-10-31 DIAGNOSIS — I10 Essential (primary) hypertension: Secondary | ICD-10-CM | POA: Diagnosis not present

## 2017-10-31 DIAGNOSIS — R2689 Other abnormalities of gait and mobility: Secondary | ICD-10-CM | POA: Diagnosis not present

## 2017-11-01 DIAGNOSIS — I480 Paroxysmal atrial fibrillation: Secondary | ICD-10-CM | POA: Diagnosis not present

## 2017-11-01 DIAGNOSIS — I951 Orthostatic hypotension: Secondary | ICD-10-CM | POA: Diagnosis not present

## 2017-11-01 DIAGNOSIS — I10 Essential (primary) hypertension: Secondary | ICD-10-CM | POA: Diagnosis not present

## 2017-11-05 DIAGNOSIS — M6281 Muscle weakness (generalized): Secondary | ICD-10-CM | POA: Diagnosis not present

## 2017-11-05 DIAGNOSIS — R269 Unspecified abnormalities of gait and mobility: Secondary | ICD-10-CM | POA: Diagnosis not present

## 2017-11-05 DIAGNOSIS — I10 Essential (primary) hypertension: Secondary | ICD-10-CM | POA: Diagnosis not present

## 2017-11-05 DIAGNOSIS — M170 Bilateral primary osteoarthritis of knee: Secondary | ICD-10-CM | POA: Diagnosis not present

## 2017-11-09 DIAGNOSIS — M170 Bilateral primary osteoarthritis of knee: Secondary | ICD-10-CM | POA: Diagnosis not present

## 2017-11-09 DIAGNOSIS — M6281 Muscle weakness (generalized): Secondary | ICD-10-CM | POA: Diagnosis not present

## 2017-11-09 DIAGNOSIS — R269 Unspecified abnormalities of gait and mobility: Secondary | ICD-10-CM | POA: Diagnosis not present

## 2017-11-09 DIAGNOSIS — I10 Essential (primary) hypertension: Secondary | ICD-10-CM | POA: Diagnosis not present

## 2017-11-16 DIAGNOSIS — M170 Bilateral primary osteoarthritis of knee: Secondary | ICD-10-CM | POA: Diagnosis not present

## 2017-11-16 DIAGNOSIS — R269 Unspecified abnormalities of gait and mobility: Secondary | ICD-10-CM | POA: Diagnosis not present

## 2017-11-16 DIAGNOSIS — M6281 Muscle weakness (generalized): Secondary | ICD-10-CM | POA: Diagnosis not present

## 2017-11-16 DIAGNOSIS — I10 Essential (primary) hypertension: Secondary | ICD-10-CM | POA: Diagnosis not present

## 2017-12-12 DIAGNOSIS — Z0000 Encounter for general adult medical examination without abnormal findings: Secondary | ICD-10-CM | POA: Diagnosis not present

## 2017-12-12 DIAGNOSIS — I10 Essential (primary) hypertension: Secondary | ICD-10-CM | POA: Diagnosis not present

## 2018-01-15 DIAGNOSIS — I10 Essential (primary) hypertension: Secondary | ICD-10-CM | POA: Diagnosis not present

## 2018-01-15 DIAGNOSIS — E785 Hyperlipidemia, unspecified: Secondary | ICD-10-CM | POA: Diagnosis not present

## 2018-01-15 DIAGNOSIS — M1991 Primary osteoarthritis, unspecified site: Secondary | ICD-10-CM | POA: Diagnosis not present

## 2018-02-11 ENCOUNTER — Other Ambulatory Visit: Payer: Self-pay

## 2018-02-11 NOTE — Patient Outreach (Signed)
[facility] Care Management  02/11/2018  Yvette Lozano 11-25-1925 [id number]   Medication Adherence call to Yvette Lozano patient's Telephone Number is disconnected Patient is due on Amlodepine/Benazepril 10/20 mg. Yvette Lozano is showing past due under [facility].   Lillia AbedAna Ollison-Moran CPhT Pharmacy Technician [facility] Care Management Direct Dial [phone]  Fax [phone] [email or web address]

## 2018-03-07 DIAGNOSIS — E785 Hyperlipidemia, unspecified: Secondary | ICD-10-CM | POA: Diagnosis not present

## 2018-03-07 DIAGNOSIS — I10 Essential (primary) hypertension: Secondary | ICD-10-CM | POA: Diagnosis not present

## 2018-03-07 DIAGNOSIS — E118 Type 2 diabetes mellitus with unspecified complications: Secondary | ICD-10-CM | POA: Diagnosis not present

## 2018-03-18 DIAGNOSIS — I951 Orthostatic hypotension: Secondary | ICD-10-CM | POA: Insufficient documentation

## 2018-03-18 DIAGNOSIS — I480 Paroxysmal atrial fibrillation: Secondary | ICD-10-CM | POA: Insufficient documentation

## 2018-04-30 NOTE — Progress Notes (Deleted)
Patient is here for follow up visit.  Subjective:   Yvette Lozano, female    DOB: 1925/06/07, 85 y.o.   MRN: [id number]   No chief complaint on file.   *** HPI  85 year old demented female with paroxysmal fibrillation, h/o syncope and orthostatic hypotension.  At her last visit with me in 10/2017, at start the patient on Eliquis 5 mg twice daily after discussing risks/benefits of anticoagulation.  Patient's daughter was present for that visit.  Patient is here for 83-month follow-up.  *** Past Medical History:  Diagnosis Date  . Arthritis   . CHF (congestive heart failure) (HCC)   . Hypertension     *** Past Surgical History:  Procedure Laterality Date  . ABDOMINAL HYSTERECTOMY      *** Social History   Socioeconomic History  . Marital status: Widowed    Spouse name: Not on file  . Number of children: Not on file  . Years of education: Not on file  . Highest education level: Not on file  Occupational History  . Not on file  Social Needs  . Financial resource strain: Not on file  . Food insecurity:    Worry: Not on file    Inability: Not on file  . Transportation needs:    Medical: Not on file    Non-medical: Not on file  Tobacco Use  . Smoking status: Never Smoker  . Smokeless tobacco: Never Used  Substance and Sexual Activity  . Alcohol use: No  . Drug use: No  . Sexual activity: Not Currently  Lifestyle  . Physical activity:    Days per week: Not on file    Minutes per session: Not on file  . Stress: Not on file  Relationships  . Social connections:    Talks on phone: Not on file    Gets together: Not on file    Attends religious service: Not on file    Active member of club or organization: Not on file    Attends meetings of clubs or organizations: Not on file    Relationship status: Not on file  . Intimate partner violence:    Fear of current or ex partner: Not on file    Emotionally abused: Not on file    Physically abused: Not on  file    Forced sexual activity: Not on file  Other Topics Concern  . Not on file  Social History Narrative  . Not on file    *** Current Outpatient Medications on File Prior to Visit  Medication Sig Dispense Refill  . amLODipine-benazepril (LOTREL) 10-20 MG capsule Take 1 capsule by mouth daily.    Marland Kitchen apixaban (ELIQUIS) 5 MG TABS tablet Take 5 mg by mouth 2 (two) times daily.    Marland Kitchen aspirin EC 81 MG tablet Take 1 tablet (81 mg total) by mouth daily. (Patient not taking: Reported on 03/18/2018) 30 tablet 3  . citalopram (CELEXA) 10 MG tablet Take 10 mg by mouth daily.    . predniSONE (DELTASONE) 2.5 MG tablet Take 2.5 mg by mouth daily.     No current facility-administered medications on file prior to visit.     Cardiovascular studies:  *** Event monitor 09/14/2017 - 10/13/2017: One episode of atrial fibrillation with RVR, with aberrant conduction.  Rest, sinus rhythm and tachycardia with occasional PACs and PVCs.  Symptoms of fatigue collates with sinus rhythm.  EKG 09/0/2019: Sinus rhythm 79 bpm. Borderline left atrial enlargement. Nonspecific ST-T change anterolateral leads  Hospital echocardiogram 09/12/2017: Study Conclusions   - Left ventricle: The cavity size was normal. Wall thickness was   normal. Systolic function was normal. The estimated ejection   fraction was in the range of 60% to 65%. Wall motion was normal;   there were no regional wall motion abnormalities. Doppler   parameters are consistent with abnormal left ventricular   relaxation (grade 1 diastolic dysfunction). Doppler parameters   are consistent with high ventricular filling pressure. - Aortic valve: There was trivial regurgitation. - Pulmonary arteries: Systolic pressure was mildly increased. PA   peak pressure: 34 mm Hg (S). - Pericardium, extracardiac: A trivial pericardial effusion was   identified.   Impressions:   - Normal LV systolic function; mild diastolic dysfunction; trace   AI. trace TR  with mild pulmonary hypertension.  Review of Systems  Constitution: Negative for decreased appetite, malaise/fatigue, weight gain and weight loss.  HENT: Negative for congestion.   Eyes: Negative for visual disturbance.  Cardiovascular: Negative for chest pain, dyspnea on exertion, leg swelling, palpitations and syncope (H/o syncoipe in 08/2017).  Respiratory: Negative for shortness of breath.   Endocrine: Negative for cold intolerance.  Hematologic/Lymphatic: Does not bruise/bleed easily.  Skin: Negative for itching and rash.  Musculoskeletal: Negative for myalgias.  Gastrointestinal: Negative for abdominal pain, nausea and vomiting.  Genitourinary: Negative for dysuria.  Neurological: Negative for dizziness and weakness.  Psychiatric/Behavioral: The patient is not nervous/anxious.   All other systems reviewed and are negative.      Objective:   *** There were no vitals filed for this visit.   Physical Exam  Constitutional: She is oriented to person, place, and time. She appears well-developed and well-nourished. No distress.  HENT:  Head: Normocephalic and atraumatic.  Eyes: Pupils are equal, round, and reactive to light. Conjunctivae are normal.  Neck: No JVD present.  Cardiovascular: Normal rate, regular rhythm and intact distal pulses.  No murmur heard. Pulmonary/Chest: Effort normal and breath sounds normal. She has no wheezes. She has no rales.  Abdominal: Soft. Bowel sounds are normal. There is no rebound.  Musculoskeletal:        General: No edema.  Lymphadenopathy:    She has no cervical adenopathy.  Neurological: She is alert and oriented to person, place, and time. No cranial nerve deficit.  Skin: Skin is warm and dry.  Psychiatric: She has a normal mood and affect.  Nursing note and vitals reviewed.       Assessment & Recommendations:    85 year old demented female with paroxysmal fibrillation, h/o syncope and orthostatic hypotension.  Paroxysmal atrial  fibrillation: CHA2DS2VASc score 3, annual stroke risk 3.6%. Continue Eliquis 5 mg twice daily.   ***  There are no diagnoses linked to this encounter.   Elder Negus, MD [facility]. [place] Pager: [phone] Office: [phone] If no answer Cell [phone]

## 2018-05-01 ENCOUNTER — Telehealth: Payer: Self-pay

## 2018-05-01 ENCOUNTER — Ambulatory Visit: Payer: Medicare Other | Admitting: Cardiology

## 2018-05-01 ENCOUNTER — Ambulatory Visit: Payer: Self-pay | Admitting: Cardiology

## 2018-05-01 NOTE — Telephone Encounter (Signed)
05/01/18 called patient to r/s 05/01/18 MP fu visit as she missed this appointment. A gentelman answered the phone. Did not mention name. I asked to please have Yvette Lozano call Dr Lynwood Dawley office.

## 2018-05-02 ENCOUNTER — Telehealth: Payer: Self-pay

## 2018-06-24 ENCOUNTER — Telehealth: Payer: Self-pay

## 2018-11-19 IMAGING — CR DG CHEST 2V
2 series · 2 of 2 positions shown · non-contrast
Comparison: 08/21/09

CLINICAL DATA: Per EMS, pt from home here for syncopal episode. Pt
alert and oriented x 4. Family states the pt seemed to pass out
while in her chair, family members then helped the pt down to the
floor. Pt denies injury or pain.

EXAM:
CHEST - 2 VIEW

[w chest lat]
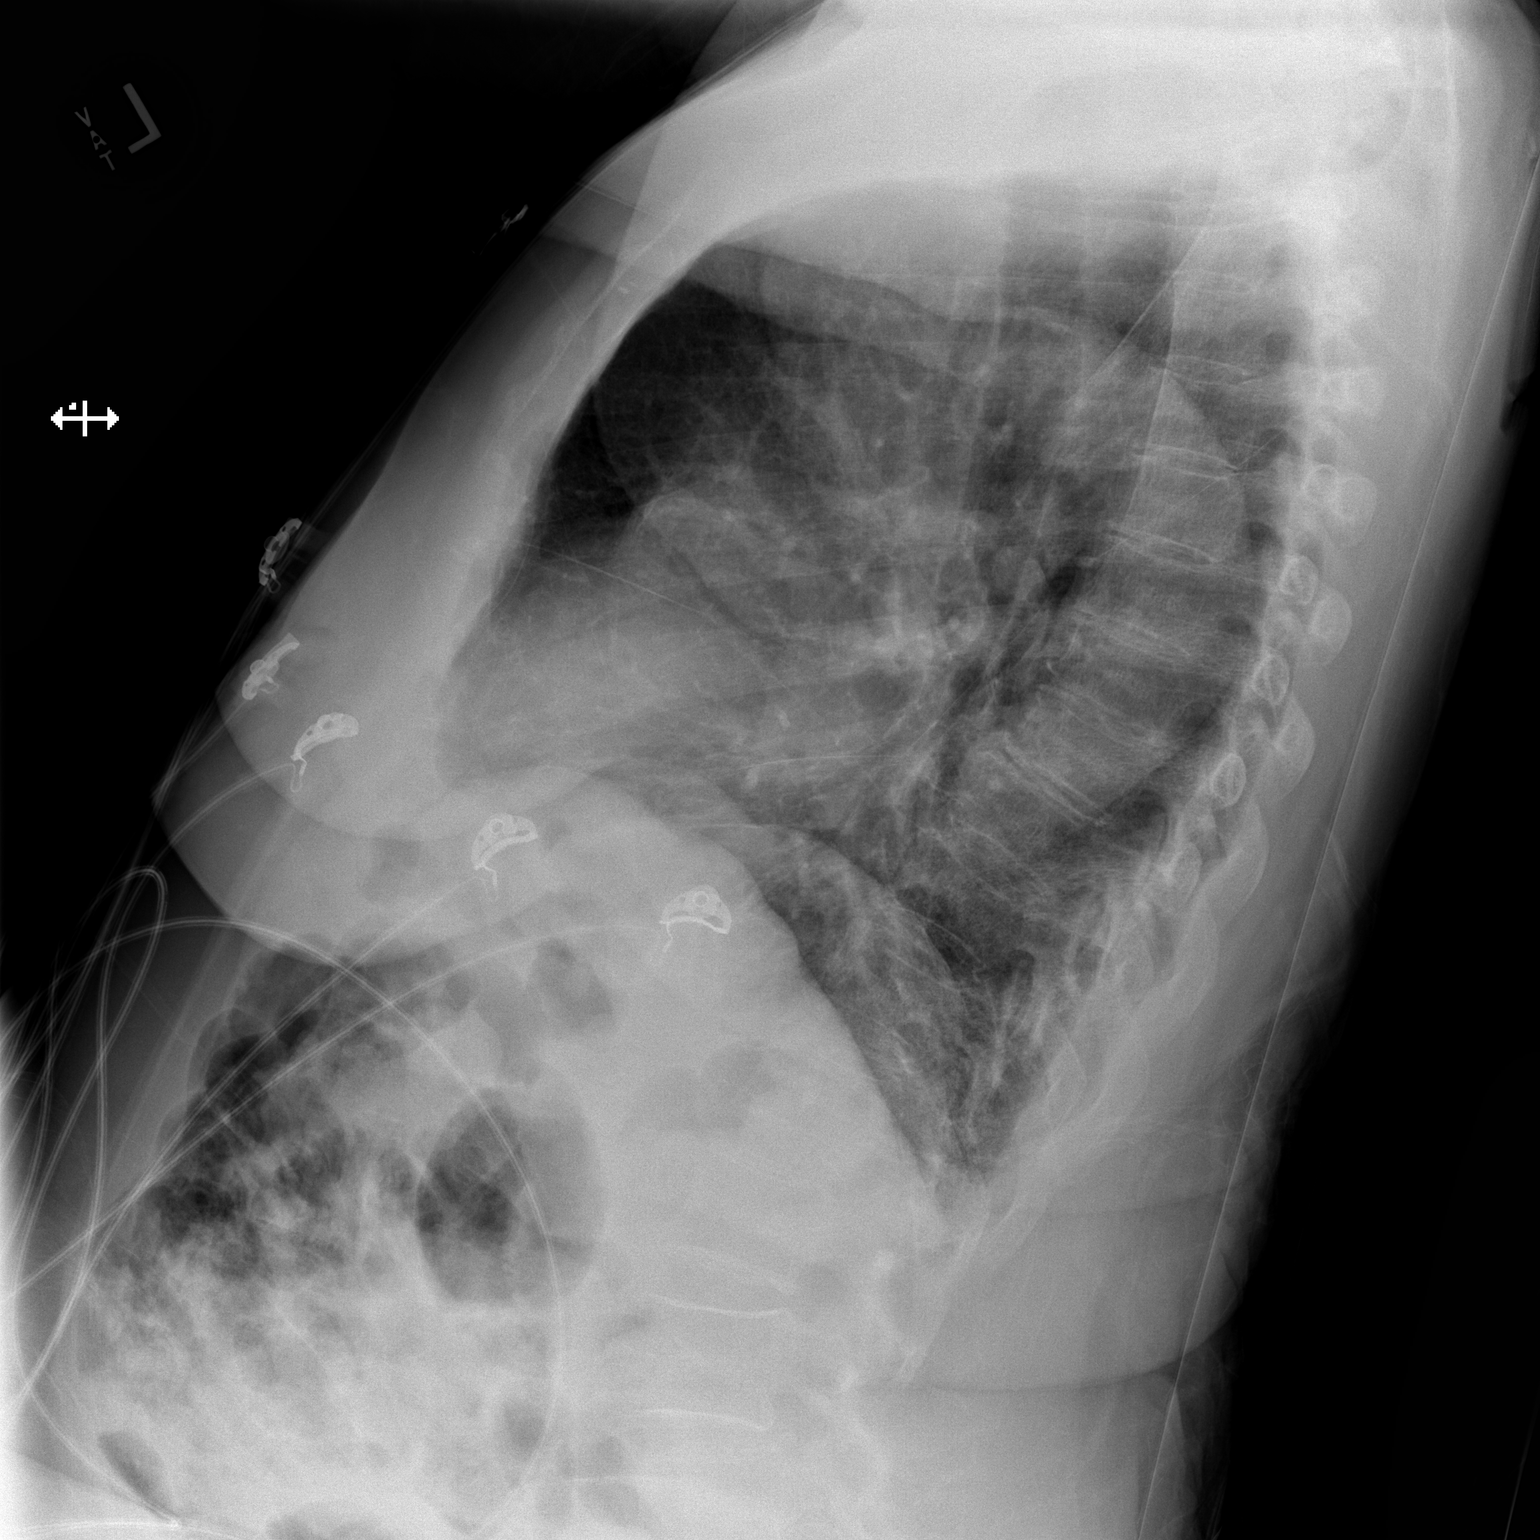

[x chest ap]
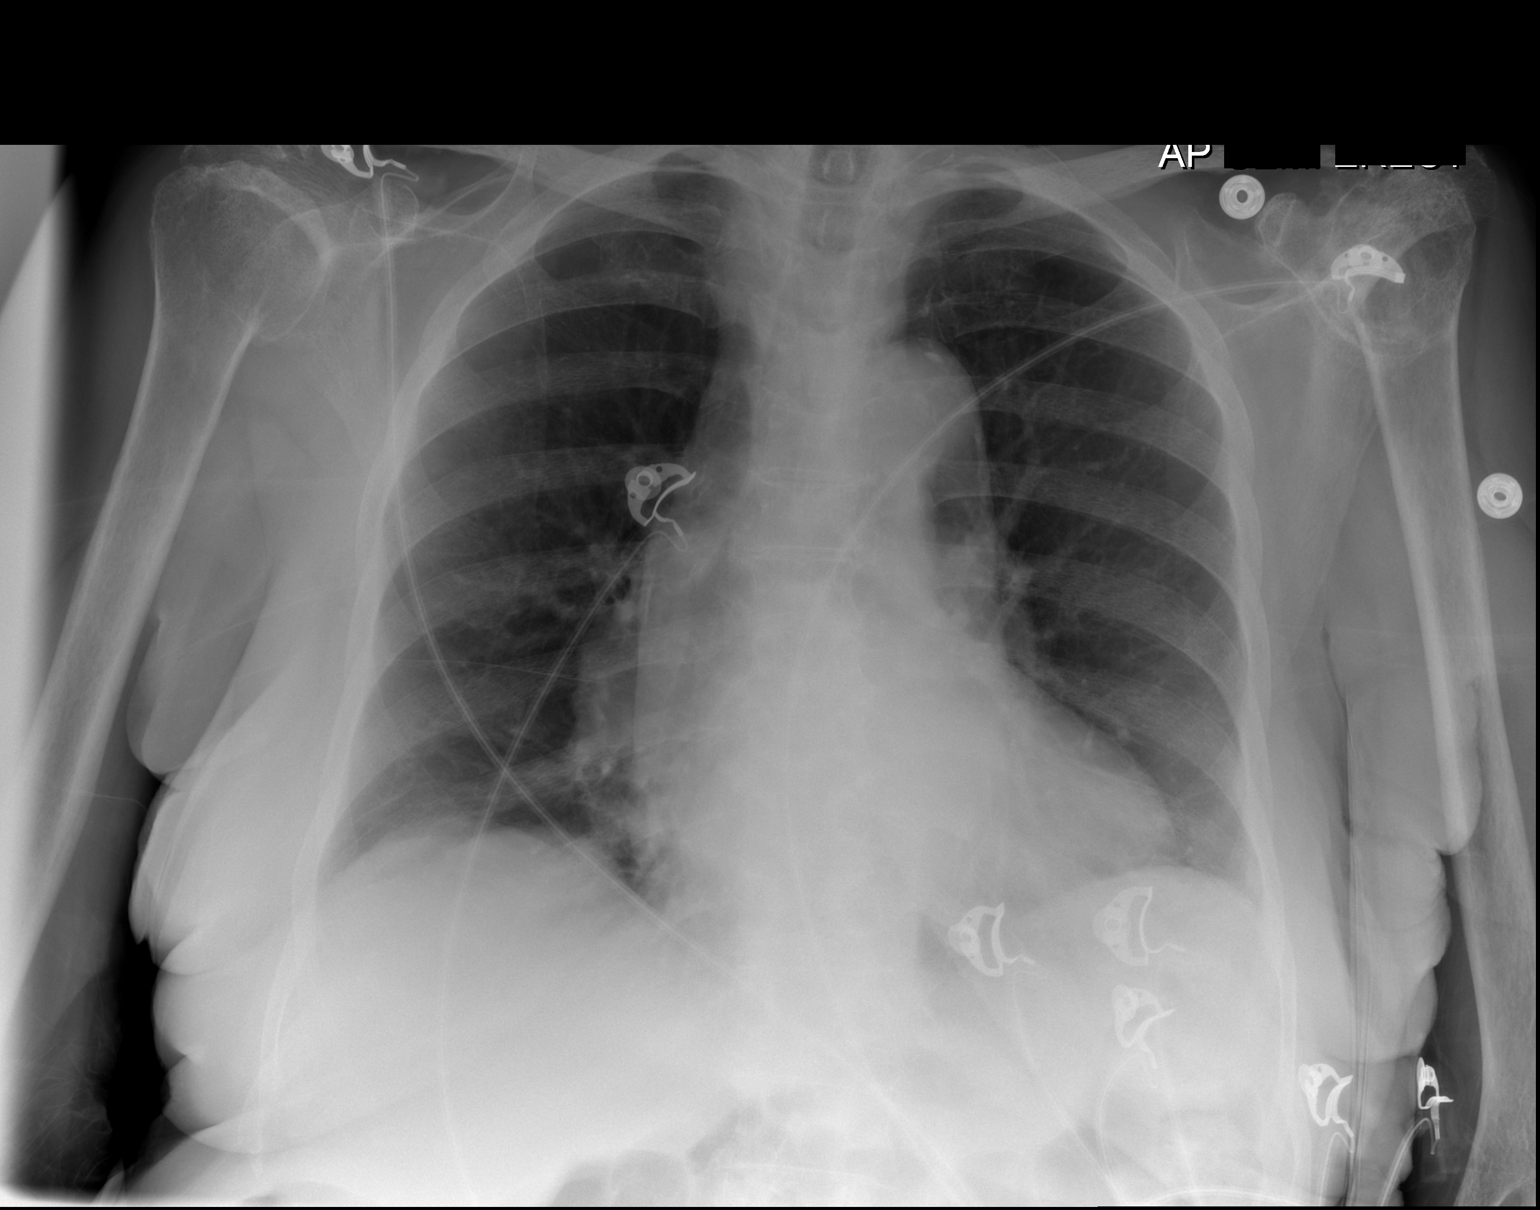

[2 of 2 positions shown; findings below may reference images not displayed]

FINDINGS: Cardiac silhouette is normal in size. No mediastinal or hilar
masses. No evidence of adenopathy.

Prominent bronchovascular markings in the lung bases. Lungs
otherwise clear.

No pleural effusion or pneumothorax.

Skeletal structures are demineralized but grossly intact.
IMPRESSION: No active cardiopulmonary disease.

## 2019-03-31 DEATH — deceased

## 2022-10-11 NOTE — Telephone Encounter (Signed)
done
